# Patient Record
Sex: Female | Born: 1935 | Race: White | Hispanic: No | Marital: Married | Smoking: Never smoker
Health system: Southern US, Community
[De-identification: ages and names within clinical notes are randomized; demographics above are authoritative.]

## PROBLEM LIST (undated history)

## (undated) DIAGNOSIS — K743 Primary biliary cirrhosis: Secondary | ICD-10-CM

## (undated) DIAGNOSIS — M358 Other specified systemic involvement of connective tissue: Secondary | ICD-10-CM

## (undated) DIAGNOSIS — I2510 Atherosclerotic heart disease of native coronary artery without angina pectoris: Secondary | ICD-10-CM

## (undated) DIAGNOSIS — I10 Essential (primary) hypertension: Secondary | ICD-10-CM

## (undated) HISTORY — PX: ABDOMINAL HYSTERECTOMY: SHX81

---

## 2004-07-05 ENCOUNTER — Inpatient Hospital Stay (HOSPITAL_COMMUNITY): Admission: RE | Admit: 2004-07-05 | Discharge: 2004-07-08 | Payer: Self-pay | Admitting: Gynecology

## 2004-07-30 ENCOUNTER — Ambulatory Visit: Payer: Self-pay | Admitting: Gynecology

## 2005-09-11 ENCOUNTER — Ambulatory Visit: Payer: Self-pay | Admitting: Internal Medicine

## 2006-09-15 ENCOUNTER — Ambulatory Visit: Payer: Self-pay | Admitting: Unknown Physician Specialty

## 2007-01-19 ENCOUNTER — Ambulatory Visit: Payer: Self-pay | Admitting: Unknown Physician Specialty

## 2007-07-16 ENCOUNTER — Ambulatory Visit: Payer: Self-pay

## 2007-11-09 ENCOUNTER — Ambulatory Visit: Payer: Self-pay | Admitting: Unknown Physician Specialty

## 2007-11-10 ENCOUNTER — Ambulatory Visit: Payer: Self-pay | Admitting: Unknown Physician Specialty

## 2008-05-15 ENCOUNTER — Ambulatory Visit: Payer: Self-pay | Admitting: Unknown Physician Specialty

## 2008-06-23 ENCOUNTER — Inpatient Hospital Stay: Payer: Self-pay | Admitting: Internal Medicine

## 2008-07-08 ENCOUNTER — Inpatient Hospital Stay: Payer: Self-pay | Admitting: Internal Medicine

## 2009-05-12 ENCOUNTER — Emergency Department: Payer: Self-pay | Admitting: Internal Medicine

## 2009-06-19 ENCOUNTER — Inpatient Hospital Stay: Payer: Self-pay | Admitting: Internal Medicine

## 2009-09-20 ENCOUNTER — Inpatient Hospital Stay: Payer: Self-pay | Admitting: *Deleted

## 2009-11-01 ENCOUNTER — Ambulatory Visit: Payer: Self-pay | Admitting: Internal Medicine

## 2009-11-07 ENCOUNTER — Ambulatory Visit: Payer: Self-pay | Admitting: Internal Medicine

## 2010-02-25 ENCOUNTER — Emergency Department: Payer: Self-pay | Admitting: Internal Medicine

## 2010-03-06 ENCOUNTER — Observation Stay: Payer: Self-pay | Admitting: Internal Medicine

## 2010-03-11 ENCOUNTER — Emergency Department: Payer: Self-pay | Admitting: Emergency Medicine

## 2010-04-09 ENCOUNTER — Ambulatory Visit: Payer: Self-pay | Admitting: Sports Medicine

## 2010-04-29 ENCOUNTER — Ambulatory Visit: Payer: Self-pay | Admitting: Internal Medicine

## 2010-10-14 ENCOUNTER — Ambulatory Visit: Payer: Self-pay | Admitting: Gastroenterology

## 2011-06-23 DIAGNOSIS — I059 Rheumatic mitral valve disease, unspecified: Secondary | ICD-10-CM | POA: Diagnosis not present

## 2011-06-23 DIAGNOSIS — I119 Hypertensive heart disease without heart failure: Secondary | ICD-10-CM | POA: Diagnosis not present

## 2011-06-23 DIAGNOSIS — E782 Mixed hyperlipidemia: Secondary | ICD-10-CM | POA: Diagnosis not present

## 2011-06-23 DIAGNOSIS — I4891 Unspecified atrial fibrillation: Secondary | ICD-10-CM | POA: Diagnosis not present

## 2011-09-12 DIAGNOSIS — E785 Hyperlipidemia, unspecified: Secondary | ICD-10-CM | POA: Diagnosis not present

## 2011-09-12 DIAGNOSIS — I10 Essential (primary) hypertension: Secondary | ICD-10-CM | POA: Diagnosis not present

## 2011-10-16 DIAGNOSIS — E039 Hypothyroidism, unspecified: Secondary | ICD-10-CM | POA: Diagnosis not present

## 2011-10-20 DIAGNOSIS — E039 Hypothyroidism, unspecified: Secondary | ICD-10-CM | POA: Diagnosis not present

## 2011-10-22 ENCOUNTER — Ambulatory Visit: Payer: Self-pay | Admitting: Internal Medicine

## 2011-10-22 DIAGNOSIS — M25569 Pain in unspecified knee: Secondary | ICD-10-CM | POA: Diagnosis not present

## 2011-10-22 DIAGNOSIS — M898X9 Other specified disorders of bone, unspecified site: Secondary | ICD-10-CM | POA: Diagnosis not present

## 2011-12-10 DIAGNOSIS — H40019 Open angle with borderline findings, low risk, unspecified eye: Secondary | ICD-10-CM | POA: Diagnosis not present

## 2012-01-06 DIAGNOSIS — I059 Rheumatic mitral valve disease, unspecified: Secondary | ICD-10-CM | POA: Diagnosis not present

## 2012-01-06 DIAGNOSIS — I4891 Unspecified atrial fibrillation: Secondary | ICD-10-CM | POA: Diagnosis not present

## 2012-01-06 DIAGNOSIS — R0602 Shortness of breath: Secondary | ICD-10-CM | POA: Diagnosis not present

## 2012-01-06 DIAGNOSIS — I2510 Atherosclerotic heart disease of native coronary artery without angina pectoris: Secondary | ICD-10-CM | POA: Diagnosis not present

## 2012-01-16 DIAGNOSIS — M549 Dorsalgia, unspecified: Secondary | ICD-10-CM | POA: Diagnosis not present

## 2012-01-16 DIAGNOSIS — R350 Frequency of micturition: Secondary | ICD-10-CM | POA: Diagnosis not present

## 2012-01-21 DIAGNOSIS — I10 Essential (primary) hypertension: Secondary | ICD-10-CM | POA: Diagnosis not present

## 2012-01-21 DIAGNOSIS — E785 Hyperlipidemia, unspecified: Secondary | ICD-10-CM | POA: Diagnosis not present

## 2012-01-29 DIAGNOSIS — E785 Hyperlipidemia, unspecified: Secondary | ICD-10-CM | POA: Diagnosis not present

## 2012-01-29 DIAGNOSIS — I10 Essential (primary) hypertension: Secondary | ICD-10-CM | POA: Diagnosis not present

## 2012-01-29 DIAGNOSIS — M1990 Unspecified osteoarthritis, unspecified site: Secondary | ICD-10-CM | POA: Diagnosis not present

## 2012-01-29 DIAGNOSIS — E039 Hypothyroidism, unspecified: Secondary | ICD-10-CM | POA: Diagnosis not present

## 2012-02-25 DIAGNOSIS — I059 Rheumatic mitral valve disease, unspecified: Secondary | ICD-10-CM | POA: Diagnosis not present

## 2012-03-02 DIAGNOSIS — Z23 Encounter for immunization: Secondary | ICD-10-CM | POA: Diagnosis not present

## 2012-03-26 DIAGNOSIS — R350 Frequency of micturition: Secondary | ICD-10-CM | POA: Diagnosis not present

## 2012-03-26 DIAGNOSIS — N390 Urinary tract infection, site not specified: Secondary | ICD-10-CM | POA: Diagnosis not present

## 2012-04-01 DIAGNOSIS — H6520 Chronic serous otitis media, unspecified ear: Secondary | ICD-10-CM | POA: Diagnosis not present

## 2012-04-01 DIAGNOSIS — I10 Essential (primary) hypertension: Secondary | ICD-10-CM | POA: Diagnosis not present

## 2012-04-05 DIAGNOSIS — H6120 Impacted cerumen, unspecified ear: Secondary | ICD-10-CM | POA: Diagnosis not present

## 2012-04-05 DIAGNOSIS — H903 Sensorineural hearing loss, bilateral: Secondary | ICD-10-CM | POA: Diagnosis not present

## 2012-04-12 DIAGNOSIS — E039 Hypothyroidism, unspecified: Secondary | ICD-10-CM | POA: Diagnosis not present

## 2012-04-19 DIAGNOSIS — E039 Hypothyroidism, unspecified: Secondary | ICD-10-CM | POA: Diagnosis not present

## 2012-05-05 DIAGNOSIS — E039 Hypothyroidism, unspecified: Secondary | ICD-10-CM | POA: Diagnosis not present

## 2012-05-05 DIAGNOSIS — R5381 Other malaise: Secondary | ICD-10-CM | POA: Diagnosis not present

## 2012-05-05 DIAGNOSIS — R5383 Other fatigue: Secondary | ICD-10-CM | POA: Diagnosis not present

## 2012-05-05 DIAGNOSIS — E7800 Pure hypercholesterolemia, unspecified: Secondary | ICD-10-CM | POA: Diagnosis not present

## 2012-06-18 DIAGNOSIS — I2510 Atherosclerotic heart disease of native coronary artery without angina pectoris: Secondary | ICD-10-CM | POA: Diagnosis not present

## 2012-06-18 DIAGNOSIS — E785 Hyperlipidemia, unspecified: Secondary | ICD-10-CM | POA: Diagnosis not present

## 2012-06-18 DIAGNOSIS — I4891 Unspecified atrial fibrillation: Secondary | ICD-10-CM | POA: Diagnosis not present

## 2012-06-18 DIAGNOSIS — I10 Essential (primary) hypertension: Secondary | ICD-10-CM | POA: Diagnosis not present

## 2012-07-03 ENCOUNTER — Emergency Department: Payer: Self-pay | Admitting: Emergency Medicine

## 2012-07-03 DIAGNOSIS — S1093XA Contusion of unspecified part of neck, initial encounter: Secondary | ICD-10-CM | POA: Diagnosis not present

## 2012-07-03 DIAGNOSIS — Z79899 Other long term (current) drug therapy: Secondary | ICD-10-CM | POA: Diagnosis not present

## 2012-07-03 DIAGNOSIS — IMO0002 Reserved for concepts with insufficient information to code with codable children: Secondary | ICD-10-CM | POA: Diagnosis not present

## 2012-07-03 DIAGNOSIS — S0990XA Unspecified injury of head, initial encounter: Secondary | ICD-10-CM | POA: Diagnosis not present

## 2012-07-03 DIAGNOSIS — S0003XA Contusion of scalp, initial encounter: Secondary | ICD-10-CM | POA: Diagnosis not present

## 2012-07-03 DIAGNOSIS — I10 Essential (primary) hypertension: Secondary | ICD-10-CM | POA: Diagnosis not present

## 2012-07-03 LAB — URINALYSIS, COMPLETE
Glucose,UR: NEGATIVE mg/dL (ref 0–75)
Ketone: NEGATIVE
Nitrite: NEGATIVE
Ph: 7 (ref 4.5–8.0)
Protein: NEGATIVE
RBC,UR: 4 /HPF (ref 0–5)
WBC UR: NONE SEEN /HPF (ref 0–5)

## 2012-08-02 DIAGNOSIS — Z0000 Encounter for general adult medical examination without abnormal findings: Secondary | ICD-10-CM | POA: Diagnosis not present

## 2012-08-02 DIAGNOSIS — K219 Gastro-esophageal reflux disease without esophagitis: Secondary | ICD-10-CM | POA: Diagnosis not present

## 2012-08-03 DIAGNOSIS — N390 Urinary tract infection, site not specified: Secondary | ICD-10-CM | POA: Diagnosis not present

## 2012-10-07 DIAGNOSIS — M5412 Radiculopathy, cervical region: Secondary | ICD-10-CM | POA: Diagnosis not present

## 2012-10-07 DIAGNOSIS — M25519 Pain in unspecified shoulder: Secondary | ICD-10-CM | POA: Diagnosis not present

## 2012-10-07 DIAGNOSIS — M5030 Other cervical disc degeneration, unspecified cervical region: Secondary | ICD-10-CM | POA: Diagnosis not present

## 2012-10-07 DIAGNOSIS — M47817 Spondylosis without myelopathy or radiculopathy, lumbosacral region: Secondary | ICD-10-CM | POA: Diagnosis not present

## 2012-10-07 DIAGNOSIS — M4802 Spinal stenosis, cervical region: Secondary | ICD-10-CM | POA: Diagnosis not present

## 2012-10-21 DIAGNOSIS — J209 Acute bronchitis, unspecified: Secondary | ICD-10-CM | POA: Diagnosis not present

## 2012-10-21 DIAGNOSIS — R05 Cough: Secondary | ICD-10-CM | POA: Diagnosis not present

## 2012-10-26 DIAGNOSIS — R350 Frequency of micturition: Secondary | ICD-10-CM | POA: Diagnosis not present

## 2012-10-26 DIAGNOSIS — N390 Urinary tract infection, site not specified: Secondary | ICD-10-CM | POA: Diagnosis not present

## 2012-10-26 DIAGNOSIS — R319 Hematuria, unspecified: Secondary | ICD-10-CM | POA: Diagnosis not present

## 2012-10-26 DIAGNOSIS — N23 Unspecified renal colic: Secondary | ICD-10-CM | POA: Diagnosis not present

## 2012-11-04 DIAGNOSIS — E039 Hypothyroidism, unspecified: Secondary | ICD-10-CM | POA: Diagnosis not present

## 2012-11-05 ENCOUNTER — Ambulatory Visit: Payer: Self-pay | Admitting: Physical Medicine and Rehabilitation

## 2012-11-05 DIAGNOSIS — M47817 Spondylosis without myelopathy or radiculopathy, lumbosacral region: Secondary | ICD-10-CM | POA: Diagnosis not present

## 2012-11-05 DIAGNOSIS — M5030 Other cervical disc degeneration, unspecified cervical region: Secondary | ICD-10-CM | POA: Diagnosis not present

## 2012-11-05 DIAGNOSIS — M542 Cervicalgia: Secondary | ICD-10-CM | POA: Diagnosis not present

## 2012-11-08 DIAGNOSIS — M5412 Radiculopathy, cervical region: Secondary | ICD-10-CM | POA: Diagnosis not present

## 2012-11-08 DIAGNOSIS — M47817 Spondylosis without myelopathy or radiculopathy, lumbosacral region: Secondary | ICD-10-CM | POA: Diagnosis not present

## 2012-11-08 DIAGNOSIS — M4802 Spinal stenosis, cervical region: Secondary | ICD-10-CM | POA: Diagnosis not present

## 2012-11-08 DIAGNOSIS — M47812 Spondylosis without myelopathy or radiculopathy, cervical region: Secondary | ICD-10-CM | POA: Diagnosis not present

## 2012-11-18 DIAGNOSIS — J209 Acute bronchitis, unspecified: Secondary | ICD-10-CM | POA: Diagnosis not present

## 2012-12-01 DIAGNOSIS — IMO0002 Reserved for concepts with insufficient information to code with codable children: Secondary | ICD-10-CM | POA: Diagnosis not present

## 2012-12-01 DIAGNOSIS — M48062 Spinal stenosis, lumbar region with neurogenic claudication: Secondary | ICD-10-CM | POA: Diagnosis not present

## 2012-12-01 DIAGNOSIS — M5137 Other intervertebral disc degeneration, lumbosacral region: Secondary | ICD-10-CM | POA: Diagnosis not present

## 2012-12-20 DIAGNOSIS — I2510 Atherosclerotic heart disease of native coronary artery without angina pectoris: Secondary | ICD-10-CM | POA: Diagnosis not present

## 2012-12-20 DIAGNOSIS — I119 Hypertensive heart disease without heart failure: Secondary | ICD-10-CM | POA: Diagnosis not present

## 2012-12-20 DIAGNOSIS — I059 Rheumatic mitral valve disease, unspecified: Secondary | ICD-10-CM | POA: Diagnosis not present

## 2012-12-20 DIAGNOSIS — I209 Angina pectoris, unspecified: Secondary | ICD-10-CM | POA: Diagnosis not present

## 2012-12-31 DIAGNOSIS — H40009 Preglaucoma, unspecified, unspecified eye: Secondary | ICD-10-CM | POA: Diagnosis not present

## 2012-12-31 DIAGNOSIS — H40019 Open angle with borderline findings, low risk, unspecified eye: Secondary | ICD-10-CM | POA: Diagnosis not present

## 2013-01-17 DIAGNOSIS — E782 Mixed hyperlipidemia: Secondary | ICD-10-CM | POA: Diagnosis not present

## 2013-01-17 DIAGNOSIS — I495 Sick sinus syndrome: Secondary | ICD-10-CM | POA: Diagnosis not present

## 2013-01-17 DIAGNOSIS — I4891 Unspecified atrial fibrillation: Secondary | ICD-10-CM | POA: Diagnosis not present

## 2013-01-17 DIAGNOSIS — I2510 Atherosclerotic heart disease of native coronary artery without angina pectoris: Secondary | ICD-10-CM | POA: Diagnosis not present

## 2013-02-02 DIAGNOSIS — I10 Essential (primary) hypertension: Secondary | ICD-10-CM | POA: Diagnosis not present

## 2013-02-02 DIAGNOSIS — E785 Hyperlipidemia, unspecified: Secondary | ICD-10-CM | POA: Diagnosis not present

## 2013-02-22 DIAGNOSIS — M25519 Pain in unspecified shoulder: Secondary | ICD-10-CM | POA: Diagnosis not present

## 2013-02-22 DIAGNOSIS — M67919 Unspecified disorder of synovium and tendon, unspecified shoulder: Secondary | ICD-10-CM | POA: Diagnosis not present

## 2013-02-22 DIAGNOSIS — M47812 Spondylosis without myelopathy or radiculopathy, cervical region: Secondary | ICD-10-CM | POA: Diagnosis not present

## 2013-02-22 DIAGNOSIS — M5412 Radiculopathy, cervical region: Secondary | ICD-10-CM | POA: Diagnosis not present

## 2013-04-19 ENCOUNTER — Emergency Department: Payer: Self-pay | Admitting: Emergency Medicine

## 2013-04-19 DIAGNOSIS — Z95818 Presence of other cardiac implants and grafts: Secondary | ICD-10-CM | POA: Diagnosis not present

## 2013-04-19 DIAGNOSIS — I10 Essential (primary) hypertension: Secondary | ICD-10-CM | POA: Diagnosis not present

## 2013-04-19 DIAGNOSIS — Z7902 Long term (current) use of antithrombotics/antiplatelets: Secondary | ICD-10-CM | POA: Diagnosis not present

## 2013-04-19 DIAGNOSIS — R079 Chest pain, unspecified: Secondary | ICD-10-CM | POA: Diagnosis not present

## 2013-04-19 DIAGNOSIS — J069 Acute upper respiratory infection, unspecified: Secondary | ICD-10-CM | POA: Diagnosis not present

## 2013-04-19 DIAGNOSIS — Z79899 Other long term (current) drug therapy: Secondary | ICD-10-CM | POA: Diagnosis not present

## 2013-04-19 DIAGNOSIS — I498 Other specified cardiac arrhythmias: Secondary | ICD-10-CM | POA: Diagnosis not present

## 2013-04-19 DIAGNOSIS — R0602 Shortness of breath: Secondary | ICD-10-CM | POA: Diagnosis not present

## 2013-04-19 LAB — COMPREHENSIVE METABOLIC PANEL
Anion Gap: 3 — ABNORMAL LOW (ref 7–16)
Bilirubin,Total: 0.5 mg/dL (ref 0.2–1.0)
Calcium, Total: 9 mg/dL (ref 8.5–10.1)
Chloride: 104 mmol/L (ref 98–107)
Creatinine: 0.89 mg/dL (ref 0.60–1.30)
EGFR (African American): >60
EGFR (Non-African Amer.): >60
Osmolality: 276 (ref 275–301)
SGOT(AST): 24 U/L (ref 15–37)
SGPT (ALT): 27 U/L (ref 12–78)
Total Protein: 6.9 g/dL (ref 6.4–8.2)

## 2013-04-19 LAB — CBC
HCT: 43.7 % (ref 35.0–47.0)
MCHC: 34.4 g/dL (ref 32.0–36.0)
Platelet: 166 10*3/uL (ref 150–440)
RBC: 4.41 10*6/uL (ref 3.80–5.20)

## 2013-04-19 LAB — CK TOTAL AND CKMB (NOT AT ARMC): CK-MB: 1.2 ng/mL (ref 0.5–3.6)

## 2013-04-22 DIAGNOSIS — J209 Acute bronchitis, unspecified: Secondary | ICD-10-CM | POA: Diagnosis not present

## 2013-04-22 DIAGNOSIS — R5383 Other fatigue: Secondary | ICD-10-CM | POA: Diagnosis not present

## 2013-04-22 DIAGNOSIS — E039 Hypothyroidism, unspecified: Secondary | ICD-10-CM | POA: Diagnosis not present

## 2013-04-22 DIAGNOSIS — E559 Vitamin D deficiency, unspecified: Secondary | ICD-10-CM | POA: Diagnosis not present

## 2013-04-22 DIAGNOSIS — R269 Unspecified abnormalities of gait and mobility: Secondary | ICD-10-CM | POA: Diagnosis not present

## 2013-04-22 DIAGNOSIS — R5381 Other malaise: Secondary | ICD-10-CM | POA: Diagnosis not present

## 2013-07-01 DIAGNOSIS — E039 Hypothyroidism, unspecified: Secondary | ICD-10-CM | POA: Diagnosis not present

## 2013-07-01 DIAGNOSIS — I10 Essential (primary) hypertension: Secondary | ICD-10-CM | POA: Diagnosis not present

## 2013-07-26 DIAGNOSIS — R51 Headache: Secondary | ICD-10-CM | POA: Diagnosis not present

## 2013-07-27 DIAGNOSIS — R51 Headache: Secondary | ICD-10-CM | POA: Diagnosis not present

## 2013-07-27 DIAGNOSIS — R5383 Other fatigue: Secondary | ICD-10-CM | POA: Diagnosis not present

## 2013-07-27 DIAGNOSIS — R413 Other amnesia: Secondary | ICD-10-CM | POA: Diagnosis not present

## 2013-07-27 DIAGNOSIS — R5381 Other malaise: Secondary | ICD-10-CM | POA: Diagnosis not present

## 2013-08-01 ENCOUNTER — Ambulatory Visit: Payer: Self-pay | Admitting: Neurology

## 2013-08-01 DIAGNOSIS — S0990XA Unspecified injury of head, initial encounter: Secondary | ICD-10-CM | POA: Diagnosis not present

## 2013-08-01 DIAGNOSIS — R51 Headache: Secondary | ICD-10-CM | POA: Diagnosis not present

## 2013-08-01 DIAGNOSIS — R413 Other amnesia: Secondary | ICD-10-CM | POA: Diagnosis not present

## 2013-08-10 DIAGNOSIS — G9332 Myalgic encephalomyelitis/chronic fatigue syndrome: Secondary | ICD-10-CM | POA: Diagnosis not present

## 2013-08-10 DIAGNOSIS — R5382 Chronic fatigue, unspecified: Secondary | ICD-10-CM | POA: Diagnosis not present

## 2013-08-10 DIAGNOSIS — R51 Headache: Secondary | ICD-10-CM | POA: Diagnosis not present

## 2013-08-10 DIAGNOSIS — R413 Other amnesia: Secondary | ICD-10-CM | POA: Diagnosis not present

## 2013-08-18 DIAGNOSIS — E039 Hypothyroidism, unspecified: Secondary | ICD-10-CM | POA: Diagnosis not present

## 2013-08-18 DIAGNOSIS — Z0000 Encounter for general adult medical examination without abnormal findings: Secondary | ICD-10-CM | POA: Diagnosis not present

## 2013-08-18 DIAGNOSIS — R7989 Other specified abnormal findings of blood chemistry: Secondary | ICD-10-CM | POA: Diagnosis not present

## 2013-08-18 DIAGNOSIS — N3000 Acute cystitis without hematuria: Secondary | ICD-10-CM | POA: Diagnosis not present

## 2013-08-18 DIAGNOSIS — E785 Hyperlipidemia, unspecified: Secondary | ICD-10-CM | POA: Diagnosis not present

## 2013-08-26 DIAGNOSIS — E039 Hypothyroidism, unspecified: Secondary | ICD-10-CM | POA: Diagnosis not present

## 2013-08-26 DIAGNOSIS — R300 Dysuria: Secondary | ICD-10-CM | POA: Diagnosis not present

## 2013-08-31 DIAGNOSIS — I495 Sick sinus syndrome: Secondary | ICD-10-CM | POA: Diagnosis not present

## 2013-08-31 DIAGNOSIS — R42 Dizziness and giddiness: Secondary | ICD-10-CM | POA: Diagnosis not present

## 2013-08-31 DIAGNOSIS — Z7901 Long term (current) use of anticoagulants: Secondary | ICD-10-CM | POA: Diagnosis not present

## 2013-08-31 DIAGNOSIS — I4891 Unspecified atrial fibrillation: Secondary | ICD-10-CM | POA: Diagnosis not present

## 2013-09-05 DIAGNOSIS — I4891 Unspecified atrial fibrillation: Secondary | ICD-10-CM | POA: Diagnosis not present

## 2013-09-14 DIAGNOSIS — I119 Hypertensive heart disease without heart failure: Secondary | ICD-10-CM | POA: Diagnosis not present

## 2013-09-14 DIAGNOSIS — I059 Rheumatic mitral valve disease, unspecified: Secondary | ICD-10-CM | POA: Diagnosis not present

## 2013-09-14 DIAGNOSIS — I4891 Unspecified atrial fibrillation: Secondary | ICD-10-CM | POA: Diagnosis not present

## 2013-09-14 DIAGNOSIS — I495 Sick sinus syndrome: Secondary | ICD-10-CM | POA: Diagnosis not present

## 2013-09-15 DIAGNOSIS — R7989 Other specified abnormal findings of blood chemistry: Secondary | ICD-10-CM | POA: Diagnosis not present

## 2013-09-15 DIAGNOSIS — F339 Major depressive disorder, recurrent, unspecified: Secondary | ICD-10-CM | POA: Diagnosis not present

## 2013-09-15 DIAGNOSIS — R319 Hematuria, unspecified: Secondary | ICD-10-CM | POA: Diagnosis not present

## 2013-09-15 DIAGNOSIS — F0390 Unspecified dementia without behavioral disturbance: Secondary | ICD-10-CM | POA: Diagnosis not present

## 2013-09-15 DIAGNOSIS — R3129 Other microscopic hematuria: Secondary | ICD-10-CM | POA: Diagnosis not present

## 2013-11-02 DIAGNOSIS — N183 Chronic kidney disease, stage 3 unspecified: Secondary | ICD-10-CM | POA: Diagnosis not present

## 2013-11-02 DIAGNOSIS — F0390 Unspecified dementia without behavioral disturbance: Secondary | ICD-10-CM | POA: Diagnosis not present

## 2013-11-02 DIAGNOSIS — R3129 Other microscopic hematuria: Secondary | ICD-10-CM | POA: Diagnosis not present

## 2013-11-02 DIAGNOSIS — E538 Deficiency of other specified B group vitamins: Secondary | ICD-10-CM | POA: Diagnosis not present

## 2013-11-02 DIAGNOSIS — E559 Vitamin D deficiency, unspecified: Secondary | ICD-10-CM | POA: Diagnosis not present

## 2013-11-09 DIAGNOSIS — N952 Postmenopausal atrophic vaginitis: Secondary | ICD-10-CM | POA: Diagnosis not present

## 2013-11-09 DIAGNOSIS — R319 Hematuria, unspecified: Secondary | ICD-10-CM | POA: Diagnosis not present

## 2013-11-14 ENCOUNTER — Ambulatory Visit: Payer: Self-pay | Admitting: Obstetrics and Gynecology

## 2013-11-14 DIAGNOSIS — R319 Hematuria, unspecified: Secondary | ICD-10-CM | POA: Diagnosis not present

## 2013-11-14 DIAGNOSIS — K7689 Other specified diseases of liver: Secondary | ICD-10-CM | POA: Diagnosis not present

## 2013-11-24 DIAGNOSIS — R319 Hematuria, unspecified: Secondary | ICD-10-CM | POA: Diagnosis not present

## 2013-11-24 DIAGNOSIS — N952 Postmenopausal atrophic vaginitis: Secondary | ICD-10-CM | POA: Diagnosis not present

## 2013-12-16 DIAGNOSIS — S50909A Unspecified superficial injury of unspecified elbow, initial encounter: Secondary | ICD-10-CM | POA: Diagnosis not present

## 2013-12-23 DIAGNOSIS — F0390 Unspecified dementia without behavioral disturbance: Secondary | ICD-10-CM | POA: Diagnosis not present

## 2013-12-23 DIAGNOSIS — Z23 Encounter for immunization: Secondary | ICD-10-CM | POA: Diagnosis not present

## 2013-12-23 DIAGNOSIS — E538 Deficiency of other specified B group vitamins: Secondary | ICD-10-CM | POA: Diagnosis not present

## 2013-12-23 DIAGNOSIS — E559 Vitamin D deficiency, unspecified: Secondary | ICD-10-CM | POA: Diagnosis not present

## 2013-12-23 DIAGNOSIS — F339 Major depressive disorder, recurrent, unspecified: Secondary | ICD-10-CM | POA: Diagnosis not present

## 2013-12-23 DIAGNOSIS — N183 Chronic kidney disease, stage 3 unspecified: Secondary | ICD-10-CM | POA: Diagnosis not present

## 2013-12-23 DIAGNOSIS — K7689 Other specified diseases of liver: Secondary | ICD-10-CM | POA: Diagnosis not present

## 2013-12-23 DIAGNOSIS — R3129 Other microscopic hematuria: Secondary | ICD-10-CM | POA: Diagnosis not present

## 2013-12-23 DIAGNOSIS — R718 Other abnormality of red blood cells: Secondary | ICD-10-CM | POA: Diagnosis not present

## 2013-12-27 DIAGNOSIS — K7689 Other specified diseases of liver: Secondary | ICD-10-CM | POA: Diagnosis not present

## 2014-01-17 ENCOUNTER — Emergency Department: Payer: Self-pay | Admitting: Emergency Medicine

## 2014-01-17 DIAGNOSIS — I498 Other specified cardiac arrhythmias: Secondary | ICD-10-CM | POA: Diagnosis not present

## 2014-01-17 DIAGNOSIS — Z7902 Long term (current) use of antithrombotics/antiplatelets: Secondary | ICD-10-CM | POA: Diagnosis not present

## 2014-01-17 DIAGNOSIS — R0602 Shortness of breath: Secondary | ICD-10-CM | POA: Diagnosis not present

## 2014-01-17 DIAGNOSIS — Z79899 Other long term (current) drug therapy: Secondary | ICD-10-CM | POA: Diagnosis not present

## 2014-01-17 DIAGNOSIS — R059 Cough, unspecified: Secondary | ICD-10-CM | POA: Diagnosis not present

## 2014-01-17 DIAGNOSIS — R011 Cardiac murmur, unspecified: Secondary | ICD-10-CM | POA: Diagnosis not present

## 2014-01-17 DIAGNOSIS — F411 Generalized anxiety disorder: Secondary | ICD-10-CM | POA: Diagnosis not present

## 2014-01-17 DIAGNOSIS — R319 Hematuria, unspecified: Secondary | ICD-10-CM | POA: Diagnosis not present

## 2014-01-17 DIAGNOSIS — I10 Essential (primary) hypertension: Secondary | ICD-10-CM | POA: Diagnosis not present

## 2014-01-17 DIAGNOSIS — J449 Chronic obstructive pulmonary disease, unspecified: Secondary | ICD-10-CM | POA: Diagnosis not present

## 2014-01-17 DIAGNOSIS — R42 Dizziness and giddiness: Secondary | ICD-10-CM | POA: Diagnosis not present

## 2014-01-17 DIAGNOSIS — J9819 Other pulmonary collapse: Secondary | ICD-10-CM | POA: Diagnosis not present

## 2014-01-17 LAB — CBC
HCT: 45.9 % (ref 35.0–47.0)
HGB: 15 g/dL (ref 12.0–16.0)
MCH: 32.8 pg (ref 26.0–34.0)
MCHC: 32.6 g/dL (ref 32.0–36.0)
MCV: 101 fL — ABNORMAL HIGH (ref 80–100)
PLATELETS: 167 10*3/uL (ref 150–440)
RBC: 4.57 10*6/uL (ref 3.80–5.20)
RDW: 14 % (ref 11.5–14.5)
WBC: 7.5 10*3/uL (ref 3.6–11.0)

## 2014-01-17 LAB — PROTIME-INR
INR: 1
Prothrombin Time: 12.9 secs (ref 11.5–14.7)

## 2014-01-17 LAB — BASIC METABOLIC PANEL
Anion Gap: 9 (ref 7–16)
BUN: 21 mg/dL — AB (ref 7–18)
CREATININE: 1.15 mg/dL (ref 0.60–1.30)
Calcium, Total: 9.1 mg/dL (ref 8.5–10.1)
Chloride: 105 mmol/L (ref 98–107)
Co2: 29 mmol/L (ref 21–32)
EGFR (Non-African Amer.): 44 — ABNORMAL LOW
GFR CALC AF AMER: 51 — AB
Glucose: 86 mg/dL (ref 65–99)
Osmolality: 287 (ref 275–301)
Potassium: 4 mmol/L (ref 3.5–5.1)
SODIUM: 143 mmol/L (ref 136–145)

## 2014-01-17 LAB — PRO B NATRIURETIC PEPTIDE: B-TYPE NATIURETIC PEPTID: 217 pg/mL (ref 0–450)

## 2014-01-17 LAB — TROPONIN I
Troponin-I: <0.02 ng/mL
Troponin-I: <0.02 ng/mL

## 2014-01-19 ENCOUNTER — Emergency Department: Payer: Self-pay | Admitting: Emergency Medicine

## 2014-01-19 DIAGNOSIS — R0602 Shortness of breath: Secondary | ICD-10-CM | POA: Diagnosis not present

## 2014-01-19 DIAGNOSIS — J984 Other disorders of lung: Secondary | ICD-10-CM | POA: Diagnosis not present

## 2014-01-19 DIAGNOSIS — I10 Essential (primary) hypertension: Secondary | ICD-10-CM | POA: Diagnosis not present

## 2014-01-19 LAB — CBC WITH DIFFERENTIAL/PLATELET
Basophil #: 0.1 10*3/uL (ref 0.0–0.1)
Basophil %: 0.7 %
EOS ABS: 0.3 10*3/uL (ref 0.0–0.7)
Eosinophil %: 4.1 %
HCT: 41.4 % (ref 35.0–47.0)
HGB: 13.7 g/dL (ref 12.0–16.0)
Lymphocyte #: 1.7 10*3/uL (ref 1.0–3.6)
Lymphocyte %: 24.4 %
MCH: 33.1 pg (ref 26.0–34.0)
MCHC: 33.2 g/dL (ref 32.0–36.0)
MCV: 100 fL (ref 80–100)
MONO ABS: 0.8 x10 3/mm (ref 0.2–0.9)
Monocyte %: 11.1 %
Neutrophil #: 4.2 10*3/uL (ref 1.4–6.5)
Neutrophil %: 59.7 %
PLATELETS: 164 10*3/uL (ref 150–440)
RBC: 4.14 10*6/uL (ref 3.80–5.20)
RDW: 14.3 % (ref 11.5–14.5)
WBC: 7 10*3/uL (ref 3.6–11.0)

## 2014-01-19 LAB — URINALYSIS, COMPLETE
Bacteria: NONE SEEN
Bilirubin,UR: NEGATIVE
GLUCOSE, UR: NEGATIVE mg/dL (ref 0–75)
KETONE: NEGATIVE
Leukocyte Esterase: NEGATIVE
NITRITE: NEGATIVE
PH: 7 (ref 4.5–8.0)
PROTEIN: NEGATIVE
SPECIFIC GRAVITY: 1.023 (ref 1.003–1.030)
Squamous Epithelial: <1
WBC UR: <1 /HPF (ref 0–5)

## 2014-01-19 LAB — COMPREHENSIVE METABOLIC PANEL
ANION GAP: 5 — AB (ref 7–16)
AST: 31 U/L (ref 15–37)
Albumin: 3.4 g/dL (ref 3.4–5.0)
Alkaline Phosphatase: 60 U/L
BUN: 18 mg/dL (ref 7–18)
Bilirubin,Total: 0.4 mg/dL (ref 0.2–1.0)
CALCIUM: 8.7 mg/dL (ref 8.5–10.1)
CREATININE: 1 mg/dL (ref 0.60–1.30)
Chloride: 109 mmol/L — ABNORMAL HIGH (ref 98–107)
Co2: 26 mmol/L (ref 21–32)
EGFR (African American): >60
GFR CALC NON AF AMER: 52 — AB
Glucose: 93 mg/dL (ref 65–99)
Osmolality: 281 (ref 275–301)
POTASSIUM: 4 mmol/L (ref 3.5–5.1)
SGPT (ALT): 19 U/L
Sodium: 140 mmol/L (ref 136–145)
TOTAL PROTEIN: 7.4 g/dL (ref 6.4–8.2)

## 2014-01-19 LAB — CK TOTAL AND CKMB (NOT AT ARMC)
CK, Total: 94 U/L
CK-MB: 1.2 ng/mL (ref 0.5–3.6)

## 2014-01-19 LAB — PROTIME-INR
INR: 1
Prothrombin Time: 12.6 secs (ref 11.5–14.7)

## 2014-01-19 LAB — D-DIMER(ARMC): D-Dimer: 786 ng/ml

## 2014-01-19 LAB — TSH: THYROID STIMULATING HORM: 5.2 u[IU]/mL — AB

## 2014-01-19 LAB — TROPONIN I: Troponin-I: <0.02 ng/mL

## 2014-01-20 DIAGNOSIS — F411 Generalized anxiety disorder: Secondary | ICD-10-CM | POA: Diagnosis not present

## 2014-01-20 DIAGNOSIS — R0602 Shortness of breath: Secondary | ICD-10-CM | POA: Diagnosis not present

## 2014-01-25 DIAGNOSIS — R3129 Other microscopic hematuria: Secondary | ICD-10-CM | POA: Diagnosis not present

## 2014-02-09 DIAGNOSIS — R0602 Shortness of breath: Secondary | ICD-10-CM | POA: Diagnosis not present

## 2014-02-09 DIAGNOSIS — R000 Tachycardia, unspecified: Secondary | ICD-10-CM | POA: Diagnosis not present

## 2014-03-02 DIAGNOSIS — I2510 Atherosclerotic heart disease of native coronary artery without angina pectoris: Secondary | ICD-10-CM | POA: Diagnosis not present

## 2014-03-02 DIAGNOSIS — I480 Paroxysmal atrial fibrillation: Secondary | ICD-10-CM | POA: Insufficient documentation

## 2014-03-02 DIAGNOSIS — E785 Hyperlipidemia, unspecified: Secondary | ICD-10-CM | POA: Diagnosis not present

## 2014-03-02 DIAGNOSIS — I4891 Unspecified atrial fibrillation: Secondary | ICD-10-CM | POA: Diagnosis not present

## 2014-03-02 DIAGNOSIS — I10 Essential (primary) hypertension: Secondary | ICD-10-CM | POA: Diagnosis not present

## 2014-03-02 DIAGNOSIS — R000 Tachycardia, unspecified: Secondary | ICD-10-CM | POA: Diagnosis not present

## 2014-03-06 DIAGNOSIS — Q809 Congenital ichthyosis, unspecified: Secondary | ICD-10-CM | POA: Diagnosis not present

## 2014-03-06 DIAGNOSIS — C44621 Squamous cell carcinoma of skin of unspecified upper limb, including shoulder: Secondary | ICD-10-CM | POA: Diagnosis not present

## 2014-03-06 DIAGNOSIS — L821 Other seborrheic keratosis: Secondary | ICD-10-CM | POA: Diagnosis not present

## 2014-03-06 DIAGNOSIS — L578 Other skin changes due to chronic exposure to nonionizing radiation: Secondary | ICD-10-CM | POA: Diagnosis not present

## 2014-03-07 DIAGNOSIS — I4891 Unspecified atrial fibrillation: Secondary | ICD-10-CM | POA: Diagnosis not present

## 2014-03-17 DIAGNOSIS — N390 Urinary tract infection, site not specified: Secondary | ICD-10-CM | POA: Diagnosis not present

## 2014-03-20 DIAGNOSIS — E782 Mixed hyperlipidemia: Secondary | ICD-10-CM | POA: Diagnosis not present

## 2014-03-20 DIAGNOSIS — I480 Paroxysmal atrial fibrillation: Secondary | ICD-10-CM | POA: Diagnosis not present

## 2014-03-20 DIAGNOSIS — I10 Essential (primary) hypertension: Secondary | ICD-10-CM | POA: Diagnosis not present

## 2014-03-20 DIAGNOSIS — I2510 Atherosclerotic heart disease of native coronary artery without angina pectoris: Secondary | ICD-10-CM | POA: Diagnosis not present

## 2014-03-20 DIAGNOSIS — I4891 Unspecified atrial fibrillation: Secondary | ICD-10-CM | POA: Diagnosis not present

## 2014-03-22 DIAGNOSIS — I493 Ventricular premature depolarization: Secondary | ICD-10-CM | POA: Insufficient documentation

## 2014-03-31 DIAGNOSIS — Z23 Encounter for immunization: Secondary | ICD-10-CM | POA: Diagnosis not present

## 2014-07-19 ENCOUNTER — Ambulatory Visit: Payer: Self-pay | Admitting: Family Medicine

## 2014-07-19 DIAGNOSIS — M25561 Pain in right knee: Secondary | ICD-10-CM | POA: Diagnosis not present

## 2014-07-19 DIAGNOSIS — M7989 Other specified soft tissue disorders: Secondary | ICD-10-CM | POA: Diagnosis not present

## 2014-07-19 DIAGNOSIS — M25562 Pain in left knee: Secondary | ICD-10-CM | POA: Diagnosis not present

## 2014-07-19 DIAGNOSIS — M25569 Pain in unspecified knee: Secondary | ICD-10-CM | POA: Diagnosis not present

## 2014-07-19 DIAGNOSIS — M7522 Bicipital tendinitis, left shoulder: Secondary | ICD-10-CM | POA: Diagnosis not present

## 2014-07-27 DIAGNOSIS — M1711 Unilateral primary osteoarthritis, right knee: Secondary | ICD-10-CM | POA: Diagnosis not present

## 2014-08-22 DIAGNOSIS — H905 Unspecified sensorineural hearing loss: Secondary | ICD-10-CM | POA: Diagnosis not present

## 2014-08-22 DIAGNOSIS — H6123 Impacted cerumen, bilateral: Secondary | ICD-10-CM | POA: Diagnosis not present

## 2014-09-18 DIAGNOSIS — D7589 Other specified diseases of blood and blood-forming organs: Secondary | ICD-10-CM | POA: Diagnosis not present

## 2014-09-18 DIAGNOSIS — R300 Dysuria: Secondary | ICD-10-CM | POA: Diagnosis not present

## 2014-09-18 DIAGNOSIS — E039 Hypothyroidism, unspecified: Secondary | ICD-10-CM | POA: Diagnosis not present

## 2014-09-18 DIAGNOSIS — F0390 Unspecified dementia without behavioral disturbance: Secondary | ICD-10-CM | POA: Diagnosis not present

## 2014-09-18 DIAGNOSIS — E559 Vitamin D deficiency, unspecified: Secondary | ICD-10-CM | POA: Diagnosis not present

## 2014-09-18 DIAGNOSIS — E785 Hyperlipidemia, unspecified: Secondary | ICD-10-CM | POA: Diagnosis not present

## 2014-09-18 DIAGNOSIS — I10 Essential (primary) hypertension: Secondary | ICD-10-CM | POA: Diagnosis not present

## 2014-09-18 DIAGNOSIS — E538 Deficiency of other specified B group vitamins: Secondary | ICD-10-CM | POA: Diagnosis not present

## 2014-09-18 DIAGNOSIS — N183 Chronic kidney disease, stage 3 (moderate): Secondary | ICD-10-CM | POA: Diagnosis not present

## 2014-10-16 DIAGNOSIS — R319 Hematuria, unspecified: Secondary | ICD-10-CM | POA: Diagnosis not present

## 2014-10-16 DIAGNOSIS — I10 Essential (primary) hypertension: Secondary | ICD-10-CM | POA: Diagnosis not present

## 2014-10-16 DIAGNOSIS — R300 Dysuria: Secondary | ICD-10-CM | POA: Diagnosis not present

## 2014-10-16 DIAGNOSIS — F0390 Unspecified dementia without behavioral disturbance: Secondary | ICD-10-CM | POA: Diagnosis not present

## 2014-10-23 ENCOUNTER — Other Ambulatory Visit: Payer: Self-pay

## 2014-10-23 ENCOUNTER — Emergency Department
Admission: EM | Admit: 2014-10-23 | Discharge: 2014-10-23 | Disposition: A | Discharge status: Home / Self Care | Payer: Medicare Other | Attending: Emergency Medicine | Admitting: Emergency Medicine

## 2014-10-23 ENCOUNTER — Emergency Department: Payer: Medicare Other

## 2014-10-23 ENCOUNTER — Encounter: Payer: Self-pay | Admitting: Emergency Medicine

## 2014-10-23 DIAGNOSIS — I2510 Atherosclerotic heart disease of native coronary artery without angina pectoris: Secondary | ICD-10-CM | POA: Diagnosis not present

## 2014-10-23 DIAGNOSIS — R002 Palpitations: Secondary | ICD-10-CM | POA: Diagnosis not present

## 2014-10-23 DIAGNOSIS — Z9861 Coronary angioplasty status: Secondary | ICD-10-CM | POA: Insufficient documentation

## 2014-10-23 DIAGNOSIS — I10 Essential (primary) hypertension: Secondary | ICD-10-CM | POA: Diagnosis not present

## 2014-10-23 HISTORY — DX: Other specified systemic involvement of connective tissue: M35.8

## 2014-10-23 HISTORY — DX: Primary biliary cirrhosis: K74.3

## 2014-10-23 HISTORY — DX: Atherosclerotic heart disease of native coronary artery without angina pectoris: I25.10

## 2014-10-23 HISTORY — DX: Essential (primary) hypertension: I10

## 2014-10-23 LAB — BASIC METABOLIC PANEL
ANION GAP: 8 (ref 5–15)
BUN: 18 mg/dL (ref 6–20)
CALCIUM: 9.3 mg/dL (ref 8.9–10.3)
CHLORIDE: 104 mmol/L (ref 101–111)
CO2: 29 mmol/L (ref 22–32)
Creatinine, Ser: 0.97 mg/dL (ref 0.44–1.00)
GFR calc Af Amer: >60 mL/min (ref >60)
GFR, EST NON AFRICAN AMERICAN: 53 mL/min — AB (ref >60)
Glucose, Bld: 123 mg/dL — ABNORMAL HIGH (ref 65–99)
POTASSIUM: 3.8 mmol/L (ref 3.5–5.1)
Sodium: 141 mmol/L (ref 135–145)

## 2014-10-23 LAB — CBC
HEMATOCRIT: 44.4 % (ref 35.0–47.0)
Hemoglobin: 15 g/dL (ref 12.0–16.0)
MCH: 32.8 pg (ref 26.0–34.0)
MCHC: 33.7 g/dL (ref 32.0–36.0)
MCV: 97.4 fL (ref 80.0–100.0)
PLATELETS: 155 10*3/uL (ref 150–440)
RBC: 4.56 MIL/uL (ref 3.80–5.20)
RDW: 13.9 % (ref 11.5–14.5)
WBC: 5.8 10*3/uL (ref 3.6–11.0)

## 2014-10-23 LAB — TROPONIN I: Troponin I: <0.03 ng/mL (ref <0.031)

## 2014-10-23 NOTE — Discharge Instructions (Signed)
As we discussed, your workup today including your EKG, labs, and chest x-ray are normal and reassuring.  You may be suffering from intermittent episodes of a heart arrhythmia, but we cannot see at at this time because her heart is beating normally.  We recommend that he follow up with your cardiologist on Thursday as scheduled.  If he develop new or worsening symptoms that concern you, please return immediately to the emergency department.   Palpitations A palpitation is the feeling that your heartbeat is irregular. It may feel like your heart is fluttering or skipping a beat. It may also feel like your heart is beating faster than normal. This is usually not a serious problem. In some cases, you may need more medical tests. HOME CARE  Avoid:  Caffeine in coffee, tea, soft drinks, diet pills, and energy drinks.  Chocolate.  Alcohol.  Stop smoking if you smoke.  Reduce your stress and anxiety. Try:  A method that measures bodily functions so you can learn to control them (biofeedback).  Yoga.  Meditation.  Physical activity such as swimming, jogging, or walking.  Get plenty of rest and sleep. GET HELP IF:  Your fast or irregular heartbeat continues after 24 hours.  Your palpitations occur more often. GET HELP RIGHT AWAY IF:   You have chest pain.  You feel short of breath.  You have a very bad headache.  You feel dizzy or pass out (faint). MAKE SURE YOU:   Understand these instructions.  Will watch your condition.  Will get help right away if you are not doing well or get worse. Document Released: 03/04/2008 Document Revised: 10/10/2013 Document Reviewed: 07/25/2011 [facility] Patient Information 2015 [place], [place]. This information is not intended to replace advice given to you by your health care provider. Make sure you discuss any questions you have with your health care provider.

## 2014-10-23 NOTE — ED Provider Notes (Signed)
[facility] Emergency Department Provider Note  ____________________________________________  Time seen: Approximately 9:15 AM  I have reviewed the triage vital signs and the nursing notes.   HISTORY  Chief Complaint Tachycardia and palpitations    HPI Felicia Terry is a 81 y.o. female with a medical history of hypertension and coronary artery disease with one stent who presents after feeling palpitations last night and then again this morning.  She reports that the episode last night lasted about an hour and this morning it was "more than an hour".  She states that along with the feeling of palpitations she felt some heaviness in her chest but no shortness of breath.  She felt some dizziness during the palpitations.  Currently she is asymptomatic.   Past Medical History  Diagnosis Date  . Hypertension   . Reynolds syndrome   . Coronary artery disease     cardiac stent    There are no active problems to display for this patient.   Past Surgical History  Procedure Laterality Date  . Abdominal hysterectomy      No current outpatient prescriptions on file.  Allergies Review of patient's allergies indicates no known allergies.  History reviewed. No pertinent family history.  Social History History  Substance Use Topics  . Smoking status: Never Smoker   . Smokeless tobacco: Not on file  . Alcohol Use: No    Review of Systems Constitutional: No fever/chills Eyes: No visual changes. ENT: No sore throat. Cardiovascular: pressure last night, now asymptomatic  Respiratory: Denies shortness of breath. Gastrointestinal: No abdominal pain.  No nausea, no vomiting.  No diarrhea.  No constipation. Genitourinary: Negative for dysuria. Musculoskeletal: Negative for back pain. Skin: Negative for rash. Neurological: Negative for headaches, focal weakness or numbness.  10-point ROS otherwise  negative.  ____________________________________________   PHYSICAL EXAM:  VITAL SIGNS: ED Triage Vitals  Enc Vitals Group     BP 10/23/14 0907 193/64 mmHg     Pulse Rate 10/23/14 0907 61     Resp 10/23/14 0907 18     Temp 10/23/14 0907 97.7 F (36.5 C)     Temp Source 10/23/14 0907 Oral     SpO2 10/23/14 0907 97 %     Weight 10/23/14 0907 130 lb (58.968 kg)     Height 10/23/14 0907 5\' 1"  (1.549 m)     Head Cir --      Peak Flow --      Pain Score 10/23/14 0908 6     Pain Loc --      Pain Edu? --      Excl. in [place]? --     Constitutional: Alert and oriented. Well appearing and in no acute distress.  Anxious but appropriate about her recent symptoms. Eyes: Conjunctivae are normal. PERRL. EOMI. Head: Atraumatic. Nose: No congestion/rhinnorhea. Mouth/Throat: Mucous membranes are moist.  Oropharynx non-erythematous. Neck: No stridor.   Cardiovascular: Normal rate, regular rhythm. Grossly normal heart sounds.  Good peripheral circulation. Respiratory: Normal respiratory effort.  No retractions. Lungs CTAB. Gastrointestinal: Soft and nontender. No distention. No abdominal bruits. No CVA tenderness. Musculoskeletal: No lower extremity tenderness nor edema.  No joint effusions. Neurologic:  Normal speech and language. No gross focal neurologic deficits are appreciated. Speech is normal. No gait instability. Skin:  Skin is warm, dry and intact. No rash noted. Psychiatric: Mood and affect are normal. Speech and behavior are normal.  ____________________________________________   LABS (all labs ordered are listed, but only abnormal results are  displayed)  Labs Reviewed  BASIC METABOLIC PANEL - Abnormal; Notable for the following:    Glucose, Bld 123 (*)    GFR calc non Af Amer 53 (*)    All other components within normal limits  CBC  TROPONIN I   ____________________________________________  EKG  ED ECG REPORT   Date: 10/23/2014  EKG Time: 09:07  Rate: 62  Rhythm:  normal sinus rhythm  Axis: Normal  Intervals:none  ST&T Change: Nonspecific ST changes  ____________________________________________  RADIOLOGY  Dg Chest 2 View  10/23/2014   CLINICAL DATA:  Cardiac palpitations  EXAM: CHEST  2 VIEW  COMPARISON:  01/17/2014  FINDINGS: Cardiac shadow is stable. The lungs are mildly hyperinflated. No focal infiltrate or sizable effusion is seen. Some scarring is again noted in the left lung base. No bony abnormality is noted.  IMPRESSION: No acute abnormality noted.   Electronically Signed   By: Inez Catalina M.D.   On: 10/23/2014 10:07    ____________________________________________   PROCEDURES  Procedure(s) performed: None  Critical Care performed: No  ____________________________________________   INITIAL IMPRESSION / ASSESSMENT AND PLAN / ED COURSE  Pertinent labs & imaging results that were available during my care of the patient were reviewed by me and considered in my medical decision making (see chart for details).  Most likely SVT, less likely other less benign arrhythmias (a-fib, etc).  Well-appearing, normal EKG, normal labs.  Patient wants very strongly to go home.  Suggested possibly staying for a second troponin, but she insists she wants to go and she has cardiology follow up in three days; given that her "chest pressure" was last night and she is asymptomatic now with a negative troponin and normal EKG, I believe discharge is appropriate.  I encouraged her to talk to her cardiologist and/or PCP about possibly getting a Holter monitor.  She left comfortable in no acute distress. ____________________________________________   FINAL CLINICAL IMPRESSION(S) / ED DIAGNOSES  Final diagnoses:  Palpitations     Hinda Kehr, MD 10/23/14 2054

## 2014-10-23 NOTE — ED Notes (Signed)
Pt to ed with c/o feeling like her heart was racing this am.  Pt states it started when she awoke this am and yesterday but reports as the day goes on she feels better.  Pt reports some "heaviness in chest" this am as well.  Pt denies sob, does report she felt dizzy with the rapid heart beat this am.

## 2014-10-23 NOTE — ED Notes (Signed)
Pt informed to return if any life threatening symptoms occur.  

## 2014-10-23 NOTE — ED Notes (Signed)
Nurse enters room to give pt a verbal update about plan of care. Pt sitting in chair with her clothes on stating, "i want to go". Pt. Starts crying stating "this place is making my sick".  Nurse set at bedside beside pt in order to help understand pt's concerns. MD ( Dr. Karma Greaser) notified.  Pt. Was told to given a few minutes and MD will be back in for an update. Pt and pt's husband verbalized understanding.

## 2014-10-25 ENCOUNTER — Ambulatory Visit: Payer: Self-pay | Admitting: Primary Care

## 2014-10-31 DIAGNOSIS — I071 Rheumatic tricuspid insufficiency: Secondary | ICD-10-CM | POA: Insufficient documentation

## 2014-10-31 DIAGNOSIS — R0681 Apnea, not elsewhere classified: Secondary | ICD-10-CM | POA: Insufficient documentation

## 2014-10-31 DIAGNOSIS — I480 Paroxysmal atrial fibrillation: Secondary | ICD-10-CM | POA: Diagnosis not present

## 2014-10-31 DIAGNOSIS — I351 Nonrheumatic aortic (valve) insufficiency: Secondary | ICD-10-CM | POA: Insufficient documentation

## 2014-10-31 DIAGNOSIS — I340 Nonrheumatic mitral (valve) insufficiency: Secondary | ICD-10-CM | POA: Insufficient documentation

## 2014-10-31 DIAGNOSIS — I25118 Atherosclerotic heart disease of native coronary artery with other forms of angina pectoris: Secondary | ICD-10-CM | POA: Diagnosis not present

## 2014-10-31 DIAGNOSIS — R001 Bradycardia, unspecified: Secondary | ICD-10-CM | POA: Insufficient documentation

## 2014-10-31 DIAGNOSIS — R0602 Shortness of breath: Secondary | ICD-10-CM | POA: Diagnosis not present

## 2014-10-31 DIAGNOSIS — R002 Palpitations: Secondary | ICD-10-CM | POA: Diagnosis not present

## 2014-11-08 DIAGNOSIS — R001 Bradycardia, unspecified: Secondary | ICD-10-CM | POA: Diagnosis not present

## 2014-11-10 DIAGNOSIS — F068 Other specified mental disorders due to known physiological condition: Secondary | ICD-10-CM | POA: Diagnosis not present

## 2014-11-10 DIAGNOSIS — R5383 Other fatigue: Secondary | ICD-10-CM | POA: Diagnosis not present

## 2014-11-14 DIAGNOSIS — R5383 Other fatigue: Secondary | ICD-10-CM | POA: Diagnosis not present

## 2014-11-14 DIAGNOSIS — F068 Other specified mental disorders due to known physiological condition: Secondary | ICD-10-CM | POA: Diagnosis not present

## 2014-11-14 DIAGNOSIS — R52 Pain, unspecified: Secondary | ICD-10-CM | POA: Diagnosis not present

## 2014-11-17 ENCOUNTER — Other Ambulatory Visit: Payer: Self-pay

## 2014-11-27 DIAGNOSIS — R0602 Shortness of breath: Secondary | ICD-10-CM | POA: Diagnosis not present

## 2014-11-27 DIAGNOSIS — I25118 Atherosclerotic heart disease of native coronary artery with other forms of angina pectoris: Secondary | ICD-10-CM | POA: Diagnosis not present

## 2014-11-28 ENCOUNTER — Ambulatory Visit: Payer: Medicare Other | Admitting: Family Medicine

## 2014-12-13 ENCOUNTER — Other Ambulatory Visit: Payer: Self-pay

## 2014-12-13 MED ORDER — LISINOPRIL 2.5 MG PO TABS: 2.5000 mg | ORAL_TABLET | Freq: Every day | ORAL | Status: DC

## 2014-12-13 NOTE — Telephone Encounter (Signed)
Creatinine, gfr, and K+ from September 18, 2014 reviewed; approved

## 2015-02-08 DIAGNOSIS — F068 Other specified mental disorders due to known physiological condition: Secondary | ICD-10-CM | POA: Diagnosis not present

## 2015-02-08 DIAGNOSIS — J42 Unspecified chronic bronchitis: Secondary | ICD-10-CM | POA: Diagnosis not present

## 2015-02-08 DIAGNOSIS — J449 Chronic obstructive pulmonary disease, unspecified: Secondary | ICD-10-CM | POA: Diagnosis not present

## 2015-02-10 DIAGNOSIS — R5383 Other fatigue: Secondary | ICD-10-CM | POA: Diagnosis not present

## 2015-02-10 DIAGNOSIS — F068 Other specified mental disorders due to known physiological condition: Secondary | ICD-10-CM | POA: Diagnosis not present

## 2015-03-09 DIAGNOSIS — F068 Other specified mental disorders due to known physiological condition: Secondary | ICD-10-CM | POA: Diagnosis not present

## 2015-03-09 DIAGNOSIS — R52 Pain, unspecified: Secondary | ICD-10-CM | POA: Diagnosis not present

## 2015-03-16 DIAGNOSIS — M19012 Primary osteoarthritis, left shoulder: Secondary | ICD-10-CM | POA: Diagnosis not present

## 2015-03-16 DIAGNOSIS — M5030 Other cervical disc degeneration, unspecified cervical region: Secondary | ICD-10-CM | POA: Diagnosis not present

## 2015-03-28 DIAGNOSIS — J209 Acute bronchitis, unspecified: Secondary | ICD-10-CM | POA: Diagnosis not present

## 2015-04-02 ENCOUNTER — Other Ambulatory Visit: Payer: Self-pay | Admitting: Family Medicine

## 2015-04-02 NOTE — Telephone Encounter (Signed)
Routing to provider  

## 2015-04-02 NOTE — Telephone Encounter (Signed)
Please let FERNE ELLINGWOOD know that I'd like to see patient for an appointment here in the office for:  High blood pressure, thyroid memory Please schedule a visit with me  in the next: month Fasting?  Not necessary Thank you, Dr. Sanda Klein I sent Rx for thyroid med as requested

## 2015-04-02 NOTE — Telephone Encounter (Signed)
Left2x vm to call us back and schedule a f/u appt.

## 2015-04-04 ENCOUNTER — Other Ambulatory Visit: Payer: Self-pay | Admitting: Family Medicine

## 2015-04-04 NOTE — Telephone Encounter (Signed)
Patient needs appt, message sent a few days ago Sending refills

## 2015-04-04 NOTE — Telephone Encounter (Signed)
Routing to provider  

## 2015-04-09 ENCOUNTER — Encounter: Payer: Self-pay | Admitting: Family Medicine

## 2015-04-09 ENCOUNTER — Ambulatory Visit (INDEPENDENT_AMBULATORY_CARE_PROVIDER_SITE_OTHER): Payer: Medicare Other | Admitting: Family Medicine

## 2015-04-09 VITALS — BP 164/79 | HR 56 | Temp 97.5°F | Ht 59.75 in | Wt 123.0 lb

## 2015-04-09 DIAGNOSIS — I10 Essential (primary) hypertension: Secondary | ICD-10-CM | POA: Diagnosis not present

## 2015-04-09 DIAGNOSIS — R5383 Other fatigue: Secondary | ICD-10-CM | POA: Diagnosis not present

## 2015-04-09 DIAGNOSIS — E785 Hyperlipidemia, unspecified: Secondary | ICD-10-CM | POA: Diagnosis not present

## 2015-04-09 DIAGNOSIS — R05 Cough: Secondary | ICD-10-CM | POA: Diagnosis not present

## 2015-04-09 DIAGNOSIS — R3129 Other microscopic hematuria: Secondary | ICD-10-CM | POA: Diagnosis not present

## 2015-04-09 DIAGNOSIS — E038 Other specified hypothyroidism: Secondary | ICD-10-CM | POA: Diagnosis not present

## 2015-04-09 DIAGNOSIS — F0390 Unspecified dementia without behavioral disturbance: Secondary | ICD-10-CM

## 2015-04-09 DIAGNOSIS — R634 Abnormal weight loss: Secondary | ICD-10-CM

## 2015-04-09 DIAGNOSIS — R059 Cough, unspecified: Secondary | ICD-10-CM | POA: Insufficient documentation

## 2015-04-09 DIAGNOSIS — J029 Acute pharyngitis, unspecified: Secondary | ICD-10-CM

## 2015-04-09 DIAGNOSIS — N183 Chronic kidney disease, stage 3 (moderate): Secondary | ICD-10-CM | POA: Diagnosis not present

## 2015-04-09 DIAGNOSIS — Z5181 Encounter for therapeutic drug level monitoring: Secondary | ICD-10-CM

## 2015-04-09 NOTE — Assessment & Plan Note (Signed)
Not under ideal control, but patient has no idea what meds she is actually taking; I asked her to please bring her medicine bottles to her appointments

## 2015-04-09 NOTE — Assessment & Plan Note (Addendum)
Check labs and refer to neurologist; do NOT drive; staff notified to call for a ride for her; she does not have a driver's license and can NOT leave the building to drive herself home; refer to neurologist

## 2015-04-09 NOTE — Assessment & Plan Note (Signed)
Check labs 

## 2015-04-09 NOTE — Assessment & Plan Note (Addendum)
CBC in house and a CXR and close f/u; pulse ox today normal; nothing on exam or in her history to actually suggest pneumonia; she apologized again for using that word

## 2015-04-09 NOTE — Assessment & Plan Note (Signed)
She has seen urologist; referred in 2015; she saw them, note from June 2015 reviewed; CT scan done November 14, 2013 reviewed; check urine again today

## 2015-04-09 NOTE — Progress Notes (Signed)
BP 164/79 mmHg  Pulse 56  Temp(Src) 97.5 F (36.4 C)  Ht 4' 11.75" (1.518 m)  Wt 123 lb (55.792 kg)  BMI 24.21 kg/m2  SpO2 97%   Subjective:    Patient ID: Felicia Terry, female    DOB: 03-04-1930, 81 y.o.   MRN: [id number]  HPI: Felicia Terry is a 81 y.o. female  Chief Complaint  Patient presents with  . URI    She thinks she has pneumonia, has been sick 2 weeks. Having weakness, no appetite, little cough, some congestion, thinks she may have had a fever.   She has been sick for a couple of weeks; I asked next if she is coughing, and she looked puzzled and then said "not a lot"; she says she is sick all the time, and I asked her to elaborate; she says she feels badly; she is weak;   I reviewed her weight and she has lost 7 pounds since last visit; she is not trying to diet; appetite is poor  One of the kids cleaning up put her bottle in the trash can; she thinks it eusaham but that's really not close to anything on her med list; she asked her pharmacist and he can't tell what that was; she did not bring her bottles; she does drive; she is always confused she says; she lives with her husband; I told her that I do not want her to drive; she says "didn't you tell me that before?" and I said yes; she says she doesn't go out of town; she says they took her drivers license away from me she says  She had blood in her urine and she does not remember seeing the kidney doctor; no abd pain and no back pain  Relevant past medical, surgical, family and social history reviewed and updated as indicated. Interim medical history since our last visit reviewed. Allergies and medications reviewed and updated.  Review of Systems  Constitutional: Positive for fatigue and unexpected weight change. Negative for fever.  HENT: Positive for sore throat. Negative for ear pain.   Respiratory: Positive for cough and shortness of breath (maybe sometimes).   Cardiovascular: Negative for palpitations.   Gastrointestinal: Positive for blood in stool (little bit of blood in her stool). Negative for abdominal pain.  Endocrine: Positive for polydipsia.  Genitourinary: Negative for hematuria.  Musculoskeletal: Negative for back pain.  Skin:       No worrisome moles  Psychiatric/Behavioral: Positive for confusion.  Per HPI unless specifically indicated above     Objective:    BP 164/79 mmHg  Pulse 56  Temp(Src) 97.5 F (36.4 C)  Ht 4' 11.75" (1.518 m)  Wt 123 lb (55.792 kg)  BMI 24.21 kg/m2  SpO2 97%  Wt Readings from Last 3 Encounters:  04/09/15 123 lb (55.792 kg)  10/23/14 130 lb (58.968 kg)  Recheck BP 164/79  Physical Exam  Constitutional: She appears well-developed and well-nourished.  Neurological: She displays no tremor.  Year: 2016, month: October, oh, I see, Oc-something Date: last one, the [place]: [facility] President: ... obama Registration: apple, penny, chair World, backwards: "dlo something w" No ifs, ands, or buts Serial 7s: ..., 10, okay... I don't know, something 9 or something 8, "I never was good at arithmatic" Recall: apple, ... "Something with a p", "nope"  Skin: Skin is warm. No ecchymosis and no rash noted. She is not diaphoretic. No pallor.  No palmar erythema; mild nailbed pallor relative to examiner  Psychiatric: Her speech is normal. Her mood appears not anxious. Cognition and memory are impaired. She does not exhibit a depressed mood. She exhibits abnormal recent memory.   Results for orders placed or performed during the hospital encounter of 10/23/14  CBC  Result Value Ref Range   WBC 5.8 3.6 - 11.0 K/uL   RBC 4.56 3.80 - 5.20 MIL/uL   Hemoglobin 15.0 12.0 - 16.0 g/dL   HCT 44.4 35.0 - 47.0 %   MCV 97.4 80.0 - 100.0 fL   MCH 32.8 26.0 - 34.0 pg   MCHC 33.7 32.0 - 36.0 g/dL   RDW 13.9 11.5 - 14.5 %   Platelets 155 150 - 440 K/uL  Basic metabolic panel  Result Value Ref Range   Sodium 141 135 - 145 mmol/L    Potassium 3.8 3.5 - 5.1 mmol/L   Chloride 104 101 - 111 mmol/L   CO2 29 22 - 32 mmol/L   Glucose, Bld 123 (H) 65 - 99 mg/dL   BUN 18 6 - 20 mg/dL   Creatinine, Ser 0.97 0.44 - 1.00 mg/dL   Calcium 9.3 8.9 - 10.3 mg/dL   GFR calc non Af Amer 53 (L) >60 mL/min   GFR calc Af Amer >60 >60 mL/min   Anion gap 8 5 - 15  Troponin I  Result Value Ref Range   Troponin I <0.03 <0.031 ng/mL  Urine today: 1+ hematuria, 3-10 RBCs/hfp CBC showed macrocytosis, see copy in labs; she is taking her B12     Assessment & Plan:   Problem List Items Addressed This Visit      Cardiovascular and Mediastinum   Essential hypertension, benign    Not under ideal control, but patient has no idea what meds she is actually taking; I asked her to please bring her medicine bottles to her appointments      Relevant Medications   propafenone (RYTHMOL SR) 225 MG 12 hr capsule     Endocrine   Other specified hypothyroidism    Check TSH today and adjust dose if needed      Relevant Orders   TSH     Nervous and Auditory   Dementia    Check labs and refer to neurologist; do NOT drive; staff notified to call for a ride for her; she does not have a driver's license and can NOT leave the building to drive herself home; refer to neurologist      Relevant Orders   Vitamin B12   Vit D  25 hydroxy (rtn osteoporosis monitoring)     Genitourinary   Hematuria, microscopic    She has seen urologist; referred in 2015; she saw them, note from June 2015 reviewed; CT scan done November 14, 2013 reviewed; check urine again today      Relevant Orders   UA/M w/rflx Culture, Routine     Other   Fatigue - Primary    Check labs      Relevant Orders   CBC With Differential/Platelet   Abnormal weight loss    Check labs today; discussed reasons for weight loss including thyroid disease, cancer, insufficient calories, etc.; CXR and labs today and close f/u      Relevant Orders   TSH   DG Chest 2 View   CBC With  Differential/Platelet   Sore throat    Check CBC      Relevant Orders   CBC With Differential/Platelet   CBC With Differential/Platelet   Cough    CBC  in house and a CXR and close f/u; pulse ox today normal; nothing on exam or in her history to actually suggest pneumonia; she apologized again for using that word      Relevant Orders   CBC With Differential/Platelet   DG Chest 2 View   CBC With Differential/Platelet   Dyslipidemia   Relevant Orders   Lipid Panel w/o Chol/HDL Ratio   Medication monitoring encounter    Check labs today      Relevant Orders   Comprehensive metabolic panel   TSH    discussed driving issues, memory, hematuria with husband who came to pick her up; explained weight loss could be cancer, and we must work this up  Follow up plan: Return 3-4 days, for fatigue, weight loss.   Orders Placed This Encounter  Procedures  . DG Chest 2 View  . UA/M w/rflx Culture, Routine  . CBC With Differential/Platelet  . Comprehensive metabolic panel  . Lipid Panel w/o Chol/HDL Ratio  . TSH  . Vitamin B12  . Vit D  25 hydroxy (rtn osteoporosis monitoring)  . CBC With Differential/Platelet

## 2015-04-09 NOTE — Assessment & Plan Note (Signed)
Check CBC 

## 2015-04-09 NOTE — Patient Instructions (Addendum)
You cannot drive... Period If you do not have a driver's license, it is against the law for you to drive a car You need to have someone else drive you to and from your appointments here We'll get some labs today and see you back later this week to go over them and continue working up your many symptoms Please have a chest xray done today or tomorrow You can have the chest xray done at [facility] Please bring ALL of your medicine bottles to all of your appointments so we know what medicines you are taking Please go back to see your neurologist, Dr. Gurney Maxin, for evaluation and treatment of your memory Please go back to see your urologist at [facility] because you still have blood in your urine

## 2015-04-09 NOTE — Assessment & Plan Note (Signed)
Check TSH today and adjust dose if needed 

## 2015-04-09 NOTE — Assessment & Plan Note (Signed)
Check labs today.

## 2015-04-09 NOTE — Assessment & Plan Note (Signed)
Check labs today; discussed reasons for weight loss including thyroid disease, cancer, insufficient calories, etc.; CXR and labs today and close f/u

## 2015-04-10 ENCOUNTER — Ambulatory Visit
Admission: RE | Admit: 2015-04-10 | Discharge: 2015-04-10 | Disposition: A | Discharge status: Home / Self Care | Payer: Medicare Other | Source: Ambulatory Visit | Attending: Family Medicine | Admitting: Family Medicine

## 2015-04-10 DIAGNOSIS — R634 Abnormal weight loss: Secondary | ICD-10-CM | POA: Diagnosis not present

## 2015-04-10 DIAGNOSIS — R0602 Shortness of breath: Secondary | ICD-10-CM | POA: Diagnosis not present

## 2015-04-10 DIAGNOSIS — R918 Other nonspecific abnormal finding of lung field: Secondary | ICD-10-CM | POA: Diagnosis not present

## 2015-04-10 DIAGNOSIS — R05 Cough: Secondary | ICD-10-CM | POA: Insufficient documentation

## 2015-04-10 LAB — COMPREHENSIVE METABOLIC PANEL
A/G RATIO: 1.8 (ref 1.1–2.5)
ALBUMIN: 4.1 g/dL (ref 3.5–4.7)
ALT: 21 IU/L (ref 0–32)
AST: 24 IU/L (ref 0–40)
Alkaline Phosphatase: 57 IU/L (ref 39–117)
BUN / CREAT RATIO: 17 (ref 11–26)
BUN: 17 mg/dL (ref 8–27)
Bilirubin Total: 0.4 mg/dL (ref 0.0–1.2)
CHLORIDE: 97 mmol/L (ref 97–106)
CO2: 26 mmol/L (ref 18–29)
Calcium: 9.3 mg/dL (ref 8.7–10.3)
Creatinine, Ser: 1 mg/dL (ref 0.57–1.00)
GFR calc Af Amer: 60 mL/min/{1.73_m2} (ref >59)
GFR calc non Af Amer: 52 mL/min/{1.73_m2} — ABNORMAL LOW (ref >59)
Globulin, Total: 2.3 g/dL (ref 1.5–4.5)
Glucose: 83 mg/dL (ref 65–99)
Potassium: 4.2 mmol/L (ref 3.5–5.2)
SODIUM: 139 mmol/L (ref 136–144)
Total Protein: 6.4 g/dL (ref 6.0–8.5)

## 2015-04-10 LAB — UA/M W/RFLX CULTURE, ROUTINE
BILIRUBIN UA: NEGATIVE
Glucose, UA: NEGATIVE
Ketones, UA: NEGATIVE
Leukocytes, UA: NEGATIVE
Nitrite, UA: NEGATIVE
PH UA: 5 (ref 5.0–7.5)
Protein, UA: NEGATIVE
SPEC GRAV UA: 1.015 (ref 1.005–1.030)
Urobilinogen, Ur: 0.2 mg/dL (ref 0.2–1.0)

## 2015-04-10 LAB — CBC WITH DIFFERENTIAL/PLATELET
Hematocrit: 40.7 % (ref 34.0–46.6)
Hemoglobin: 14.3 g/dL (ref 11.1–15.9)
Lymphocytes Absolute: 1.1 10*3/uL (ref 0.7–3.1)
Lymphs: 16 %
MCH: 35 pg — AB (ref 26.6–33.0)
MCHC: 35.1 g/dL (ref 31.5–35.7)
MCV: 100 fL — ABNORMAL HIGH (ref 79–97)
MID (ABSOLUTE): 0.8 10*3/uL (ref 0.1–1.6)
MID: 12 %
NEUTROS ABS: 4.8 10*3/uL (ref 1.4–7.0)
Neutrophils: 72 %
PLATELETS: 162 10*3/uL (ref 150–379)
RBC: 4.08 x10E6/uL (ref 3.77–5.28)
RDW: 14.9 % (ref 12.3–15.4)
WBC: 6.7 10*3/uL (ref 3.4–10.8)

## 2015-04-10 LAB — TSH: TSH: 2.27 u[IU]/mL (ref 0.450–4.500)

## 2015-04-10 LAB — MICROSCOPIC EXAMINATION

## 2015-04-10 LAB — LIPID PANEL W/O CHOL/HDL RATIO
Cholesterol, Total: 182 mg/dL (ref 100–199)
HDL: 76 mg/dL (ref >39)
LDL Calculated: 92 mg/dL (ref 0–99)
Triglycerides: 72 mg/dL (ref 0–149)
VLDL Cholesterol Cal: 14 mg/dL (ref 5–40)

## 2015-04-10 LAB — VITAMIN B12

## 2015-04-10 LAB — VITAMIN D 25 HYDROXY (VIT D DEFICIENCY, FRACTURES): Vit D, 25-Hydroxy: 38.4 ng/mL (ref 30.0–100.0)

## 2015-04-12 ENCOUNTER — Emergency Department
Admission: EM | Admit: 2015-04-12 | Discharge: 2015-04-12 | Discharge status: Against Medical Advice | Payer: Medicare Other | Attending: Emergency Medicine | Admitting: Emergency Medicine

## 2015-04-12 ENCOUNTER — Encounter: Payer: Self-pay | Admitting: *Deleted

## 2015-04-12 ENCOUNTER — Other Ambulatory Visit: Payer: Self-pay

## 2015-04-12 ENCOUNTER — Encounter: Payer: Self-pay | Admitting: Family Medicine

## 2015-04-12 ENCOUNTER — Ambulatory Visit (INDEPENDENT_AMBULATORY_CARE_PROVIDER_SITE_OTHER): Payer: Medicare Other | Admitting: Family Medicine

## 2015-04-12 VITALS — BP 134/76 | HR 64 | Temp 98.7°F | Wt 124.0 lb

## 2015-04-12 DIAGNOSIS — F0390 Unspecified dementia without behavioral disturbance: Secondary | ICD-10-CM

## 2015-04-12 DIAGNOSIS — D692 Other nonthrombocytopenic purpura: Secondary | ICD-10-CM | POA: Insufficient documentation

## 2015-04-12 DIAGNOSIS — D7589 Other specified diseases of blood and blood-forming organs: Secondary | ICD-10-CM

## 2015-04-12 DIAGNOSIS — Z5181 Encounter for therapeutic drug level monitoring: Secondary | ICD-10-CM

## 2015-04-12 DIAGNOSIS — I2510 Atherosclerotic heart disease of native coronary artery without angina pectoris: Secondary | ICD-10-CM | POA: Diagnosis not present

## 2015-04-12 DIAGNOSIS — R000 Tachycardia, unspecified: Secondary | ICD-10-CM | POA: Diagnosis not present

## 2015-04-12 DIAGNOSIS — I10 Essential (primary) hypertension: Secondary | ICD-10-CM

## 2015-04-12 DIAGNOSIS — R3129 Other microscopic hematuria: Secondary | ICD-10-CM | POA: Diagnosis not present

## 2015-04-12 DIAGNOSIS — E038 Other specified hypothyroidism: Secondary | ICD-10-CM

## 2015-04-12 LAB — BASIC METABOLIC PANEL
Anion gap: 5 (ref 5–15)
BUN: 19 mg/dL (ref 6–20)
CHLORIDE: 102 mmol/L (ref 101–111)
CO2: 28 mmol/L (ref 22–32)
Calcium: 9 mg/dL (ref 8.9–10.3)
Creatinine, Ser: 1.14 mg/dL — ABNORMAL HIGH (ref 0.44–1.00)
GFR calc Af Amer: 50 mL/min — ABNORMAL LOW (ref >60)
GFR calc non Af Amer: 43 mL/min — ABNORMAL LOW (ref >60)
GLUCOSE: 122 mg/dL — AB (ref 65–99)
POTASSIUM: 4.4 mmol/L (ref 3.5–5.1)
Sodium: 135 mmol/L (ref 135–145)

## 2015-04-12 LAB — CBC
HCT: 42.3 % (ref 35.0–47.0)
HEMOGLOBIN: 14.6 g/dL (ref 12.0–16.0)
MCH: 34 pg (ref 26.0–34.0)
MCHC: 34.4 g/dL (ref 32.0–36.0)
MCV: 98.7 fL (ref 80.0–100.0)
Platelets: 152 10*3/uL (ref 150–440)
RBC: 4.29 MIL/uL (ref 3.80–5.20)
RDW: 15 % — ABNORMAL HIGH (ref 11.5–14.5)
WBC: 6 10*3/uL (ref 3.6–11.0)

## 2015-04-12 LAB — TROPONIN I: Troponin I: <0.03 ng/mL (ref <0.031)

## 2015-04-12 MED ORDER — VITAMIN B-12 1000 MCG PO TABS: 1000.0000 ug | ORAL_TABLET | ORAL | Status: DC

## 2015-04-12 NOTE — Patient Instructions (Addendum)
You only need to take extra vitamin B12 once a week Continue all of your other pills Please do call and schedule an appointment with Dr. Melrose Nakayama for neurologist and the urologist at [facility] schedule a scan of your abdomen and pelvis Please do return the stool cards at your earliest convenience Return in 2 weeks

## 2015-04-12 NOTE — ED Notes (Addendum)
Patient is stating that she wants to go home and that her heart "feels better." 20 minute converstation to explain to patient that she needed to stay and be seen by a doctor. Patient wants to go home anyway.

## 2015-04-12 NOTE — ED Notes (Signed)
Pt reports she was in bed and heart starting beating fast.  No chest pain  No sob.  Pt states it feels like it is pounding in my chest.  No n/v/d.   Pt alert.  Skin warm and dry.

## 2015-04-12 NOTE — Progress Notes (Signed)
BP 134/76 mmHg  Pulse 64  Temp(Src) 98.7 F (37.1 C)  Wt 124 lb (56.246 kg)  SpO2 97%   Subjective:    Patient ID: Felicia Terry, female    DOB: 1930-04-29, 81 y.o.   MRN: [id number]  HPI: Felicia Terry is a 81 y.o. female  Chief Complaint  Patient presents with  . Follow-up    She states she is about the same, feels ok in the morning but as the day goes on she feels weaker.  She is here with her husband She does not go out to eat because she doesn't want to do anything She is afraid if she does too much activity, the more likely she will relapse She feels okay most of hte time when she gets up, but gets tired around 10 or 11 am; she pushes herself to keep going She went outside last night with no shoes or socks on looking for her cat; that was unusual that her cat was gone; that's not typical No appetite lately She does not have an appt yet with urologist She does not see any blood in the urine No abdominal or flank or back pain; sometimes has back pain she then says, but she doesn't listen and overdoes it   CLINICAL DATA: Abnormal weight loss for several months, no appetite, some congestion and shortness of breath  EXAM: CHEST 2 VIEW  COMPARISON: Chest x-ray of 10/23/2014  FINDINGS: The lungs are clear but somewhat hyperaerated. There is increased AP diameter and somewhat flattened hemidiaphragms suggesting an element of emphysema. Mediastinal and hilar contours are unremarkable. The heart is within normal limits in size. No bony abnormality is seen.  IMPRESSION: No definite active process. Probable emphysema.  She did not grow with smokers No hx of work in [employer], but worked tobacco  Relevant past medical, surgical, family and social history reviewed and updated as indicated. Interim medical history since our last visit reviewed. Allergies and medications reviewed and updated. She has a B12 level >2000; she is taking OTC B12 and her multiple  vitamin has B12 in it as well Her CCB was filled Nov 2015; she says she is pouring new medicine in old bottles Her aspirin is expired, Feb 2015  Review of Systems Per HPI unless specifically indicated above     Objective:    BP 134/76 mmHg  Pulse 64  Temp(Src) 98.7 F (37.1 C)  Wt 124 lb (56.246 kg)  SpO2 97%  Wt Readings from Last 3 Encounters:  04/12/15 125 lb (56.7 kg)  04/12/15 124 lb (56.246 kg)  04/09/15 123 lb (55.792 kg)    Physical Exam  Constitutional: She appears well-developed and well-nourished.  Cardiovascular: Normal rate and regular rhythm.   Pulmonary/Chest: Effort normal and breath sounds normal.  Neurological: She displays no tremor.  Skin: Skin is warm. She is not diaphoretic. No pallor.  No palmar erythema; senile purpura noted on the sun-exposed parts of both forearms and on the upper chest  Psychiatric: Her speech is normal. Her mood appears not anxious. Cognition and memory are impaired. She does not exhibit a depressed mood. She exhibits abnormal recent memory.    Results for orders placed or performed in visit on 04/09/15  Microscopic Examination  Result Value Ref Range   WBC, UA 0-5 0 -  5 /hpf   RBC, UA 3-10 (A) 0 -  2 /hpf   Epithelial Cells (non renal) 0-10 0 - 10 /hpf   Mucus, UA Present Not  Estab.   Bacteria, UA Few None seen/Few  UA/M w/rflx Culture, Routine  Result Value Ref Range   Specific Gravity, UA 1.015 1.005 - 1.030   pH, UA 5.0 5.0 - 7.5   Color, UA Yellow Yellow   Appearance Ur Clear Clear   Leukocytes, UA Negative Negative   Protein, UA Negative Negative/Trace   Glucose, UA Negative Negative   Ketones, UA Negative Negative   RBC, UA 1+ (A) Negative   Bilirubin, UA Negative Negative   Urobilinogen, Ur 0.2 0.2 - 1.0 mg/dL   Nitrite, UA Negative Negative   Microscopic Examination See below:   Comprehensive metabolic panel  Result Value Ref Range   Glucose 83 65 - 99 mg/dL   BUN 17 8 - 27 mg/dL   Creatinine, Ser 1.00  0.57 - 1.00 mg/dL   GFR calc non Af Amer 52 (L) >59 mL/min/1.73   GFR calc Af Amer 60 >59 mL/min/1.73   BUN/Creatinine Ratio 17 11 - 26   Sodium 139 136 - 144 mmol/L   Potassium 4.2 3.5 - 5.2 mmol/L   Chloride 97 97 - 106 mmol/L   CO2 26 18 - 29 mmol/L   Calcium 9.3 8.7 - 10.3 mg/dL   Total Protein 6.4 6.0 - 8.5 g/dL   Albumin 4.1 3.5 - 4.7 g/dL   Globulin, Total 2.3 1.5 - 4.5 g/dL   Albumin/Globulin Ratio 1.8 1.1 - 2.5   Bilirubin Total 0.4 0.0 - 1.2 mg/dL   Alkaline Phosphatase 57 39 - 117 IU/L   AST 24 0 - 40 IU/L   ALT 21 0 - 32 IU/L  Lipid Panel w/o Chol/HDL Ratio  Result Value Ref Range   Cholesterol, Total 182 100 - 199 mg/dL   Triglycerides 72 0 - 149 mg/dL   HDL 76 >39 mg/dL   VLDL Cholesterol Cal 14 5 - 40 mg/dL   LDL Calculated 92 0 - 99 mg/dL  TSH  Result Value Ref Range   TSH 2.270 0.450 - 4.500 uIU/mL  Vitamin B12  Result Value Ref Range   Vitamin B-12 >2000 (H) 211 - 946 pg/mL  Vit D  25 hydroxy (rtn osteoporosis monitoring)  Result Value Ref Range   Vit D, 25-Hydroxy 38.4 30.0 - 100.0 ng/mL  CBC With Differential/Platelet  Result Value Ref Range   WBC 6.7 3.4 - 10.8 x10E3/uL   RBC 4.08 3.77 - 5.28 x10E6/uL   Hemoglobin 14.3 11.1 - 15.9 g/dL   Hematocrit 40.7 34.0 - 46.6 %   MCV 100 (H) 79 - 97 fL   MCH 35.0 (H) 26.6 - 33.0 pg   MCHC 35.1 31.5 - 35.7 g/dL   RDW 14.9 12.3 - 15.4 %   Platelets 162 150 - 379 x10E3/uL   Neutrophils 72 %   Lymphs 16 %   MID 12 %   Neutrophils Absolute 4.8 1.4 - 7.0 x10E3/uL   Lymphocytes Absolute 1.1 0.7 - 3.1 x10E3/uL   MID (Absolute) 0.8 0.1 - 1.6 X10E3/uL  Protein electrophoresis, serum  Result Value Ref Range   Total Protein 6.8 6.0 - 8.5 g/dL  Specimen status report  Result Value Ref Range   specimen status report Comment       Assessment & Plan:   Problem List Items Addressed This Visit      Cardiovascular and Mediastinum   Essential hypertension, benign    Controlled today; so important to stay on  medications; stressed importance of NOT pouring new medicine into old bottles  Senile purpura (HCC)    Last albumin was normal; platelets normal        Endocrine   Other specified hypothyroidism    TSH normal at 2.27; no changes to medicine        Nervous and Auditory   Dementia    Reviewed labs; asked husband to please contact neurologist to get patient an appointment to see her dementia doctor; there may be medicine that can help her I explained        Genitourinary   Hematuria, microscopic    Stressed importance of following up with the urologist for this to patient and her husband; they will call for appointment; I am ordering CT scan in case they delay further in getting appt (I told them to get appt last visit and they had not called yet)      Relevant Orders   CT Abdomen Pelvis W Wo Contrast     Other   Medication monitoring encounter    Stressed importance of keeping medicine in original bottles; do NOT pour new medicine into old bottles      Macrocytosis - Primary    Will get UPEP and SPEP      Relevant Orders   Protein Electrophoresis, Urine Rflx.   Protein Electrophoresis, (serum)       Follow up plan: Return in about 2 weeks (around 04/26/2015) for follow-up.  An after-visit summary was printed and given to the patient at [facility].  Please see the patient instructions which may contain other information and recommendations beyond what is mentioned above in the assessment and plan.  Orders Placed This Encounter  Procedures  . CT Abdomen Pelvis W Wo Contrast  . Protein Electrophoresis, Urine Rflx.  . Protein Electrophoresis, (serum)

## 2015-04-16 ENCOUNTER — Other Ambulatory Visit: Payer: Medicare Other | Admitting: Family Medicine

## 2015-04-16 DIAGNOSIS — R634 Abnormal weight loss: Secondary | ICD-10-CM

## 2015-04-16 LAB — FECAL OCCULT BLOOD, GUAIAC
SPECIMEN 3: NEGATIVE
Specimen 1: NEGATIVE
Specimen 2: NEGATIVE

## 2015-04-16 LAB — SPECIMEN STATUS REPORT

## 2015-04-16 LAB — PROTEIN ELECTROPHORESIS, SERUM
A/G RATIO SPE: 1.1 (ref 0.7–1.7)
Albumin ELP: 3.6 g/dL (ref 2.9–4.4)
Alpha 1: 0.3 g/dL (ref 0.0–0.4)
Alpha 2: 0.9 g/dL (ref 0.4–1.0)
BETA: 1 g/dL (ref 0.7–1.3)
GAMMA GLOBULIN: 1.1 g/dL (ref 0.4–1.8)
Globulin, Total: 3.2 g/dL (ref 2.2–3.9)

## 2015-04-16 LAB — PROTEIN ELECTROPHORESIS, URINE REFLEX
ALBUMIN ELP UR: 29.7 %
Alpha-1-Globulin, U: 3.2 %
Alpha-2-Globulin, U: 18.2 %
Beta Globulin, U: 34.5 %
Gamma Globulin, U: 14.4 %
PROTEIN UR: 28.3 mg/dL

## 2015-04-16 NOTE — Assessment & Plan Note (Signed)
Reviewed labs; asked husband to please contact neurologist to get patient an appointment to see her dementia doctor; there may be medicine that can help her I explained

## 2015-04-16 NOTE — Assessment & Plan Note (Signed)
TSH normal at 2.27; no changes to medicine

## 2015-04-16 NOTE — Assessment & Plan Note (Signed)
Stressed importance of keeping medicine in original bottles; do NOT pour new medicine into old bottles

## 2015-04-16 NOTE — Assessment & Plan Note (Signed)
Will get UPEP and SPEP

## 2015-04-16 NOTE — Assessment & Plan Note (Addendum)
Stressed importance of following up with the urologist for this to patient and her husband; they will call for appointment; I am ordering CT scan in case they delay further in getting appt (I told them to get appt last visit and they had not called yet)

## 2015-04-16 NOTE — Assessment & Plan Note (Signed)
Last albumin was normal; platelets normal

## 2015-04-16 NOTE — Assessment & Plan Note (Signed)
Controlled today; so important to stay on medications; stressed importance of NOT pouring new medicine into old bottles

## 2015-04-23 ENCOUNTER — Telehealth: Payer: Self-pay

## 2015-04-23 NOTE — Telephone Encounter (Signed)
Appointment made for patient's CT of Abdomen for Thursday 04/26/2015 at 1:30 at [facility] outpatient imaging center.  Patient called and notified of appointment and to arrive at 1:15.

## 2015-04-25 NOTE — Progress Notes (Signed)
Patient notified

## 2015-04-26 ENCOUNTER — Ambulatory Visit
Admission: RE | Admit: 2015-04-26 | Discharge: 2015-04-26 | Disposition: A | Discharge status: Home / Self Care | Payer: Medicare Other | Source: Ambulatory Visit | Attending: Family Medicine | Admitting: Family Medicine

## 2015-04-26 DIAGNOSIS — K5900 Constipation, unspecified: Secondary | ICD-10-CM | POA: Diagnosis not present

## 2015-04-26 DIAGNOSIS — K4020 Bilateral inguinal hernia, without obstruction or gangrene, not specified as recurrent: Secondary | ICD-10-CM | POA: Insufficient documentation

## 2015-04-26 DIAGNOSIS — R3129 Other microscopic hematuria: Secondary | ICD-10-CM

## 2015-04-26 DIAGNOSIS — K5730 Diverticulosis of large intestine without perforation or abscess without bleeding: Secondary | ICD-10-CM | POA: Diagnosis not present

## 2015-04-26 DIAGNOSIS — K5710 Diverticulosis of small intestine without perforation or abscess without bleeding: Secondary | ICD-10-CM | POA: Diagnosis not present

## 2015-04-26 MED ORDER — IOHEXOL 300 MG/ML  SOLN
100.0000 mL | Freq: Once | INTRAMUSCULAR | Status: AC | PRN
  Administered 2015-04-26: 100 mL via INTRAVENOUS

## 2015-04-30 ENCOUNTER — Encounter: Payer: Self-pay | Admitting: Family Medicine

## 2015-04-30 ENCOUNTER — Ambulatory Visit (INDEPENDENT_AMBULATORY_CARE_PROVIDER_SITE_OTHER): Payer: Medicare Other | Admitting: Family Medicine

## 2015-04-30 VITALS — BP 145/80 | HR 61 | Temp 97.8°F | Wt 122.0 lb

## 2015-04-30 DIAGNOSIS — R059 Cough, unspecified: Secondary | ICD-10-CM

## 2015-04-30 DIAGNOSIS — K314 Gastric diverticulum: Secondary | ICD-10-CM

## 2015-04-30 DIAGNOSIS — R634 Abnormal weight loss: Secondary | ICD-10-CM

## 2015-04-30 DIAGNOSIS — Z1239 Encounter for other screening for malignant neoplasm of breast: Secondary | ICD-10-CM

## 2015-04-30 DIAGNOSIS — K4020 Bilateral inguinal hernia, without obstruction or gangrene, not specified as recurrent: Secondary | ICD-10-CM

## 2015-04-30 DIAGNOSIS — R05 Cough: Secondary | ICD-10-CM | POA: Diagnosis not present

## 2015-04-30 DIAGNOSIS — Q762 Congenital spondylolisthesis: Secondary | ICD-10-CM | POA: Diagnosis not present

## 2015-04-30 DIAGNOSIS — K458 Other specified abdominal hernia without obstruction or gangrene: Secondary | ICD-10-CM | POA: Diagnosis not present

## 2015-04-30 DIAGNOSIS — M4310 Spondylolisthesis, site unspecified: Secondary | ICD-10-CM

## 2015-04-30 NOTE — Progress Notes (Signed)
BP 145/80 mmHg  Pulse 61  Temp(Src) 97.8 F (36.6 C)  Wt 122 lb (55.339 kg)  SpO2 97%   Subjective:    Patient ID: Felicia Terry, female    DOB: [birth year], 81 y.o.   MRN: [id number]  HPI: Felicia Terry is a 81 y.o. female  Chief Complaint  Patient presents with  . Follow-up    2 week follow up, discuss CT results   She is here with her husband to go over CT scan results in detail  She has been under fair amount of stress at home; she has one grandson that she wants to kick out of the house; another grandson finished college, works part-time; having additional people in the home creates some stress in her life; she is not depressed when asked if that might be cause for weight loss  She said she had the round thing that has blood...the heart? I asked as she had difficulty starting her history, and she replied yes she was trying to talk about her heart; she has some fast heart beat and a little shortness of breath; she has a cardiologist; we reviewed history together  She is here to go over the abd/pelvic CT in detail; we reviewed that detailed She is taking Miralax, taking regularly  Impression from CT scan performed Apr 26, 2015 IMPRESSION: 1. No cause for hematuria identified. 2. Considerable pelvic floor laxity, with herniation of loops of small bowel and low position of the anorectal junction. 3. Severe central narrowing of the thecal sac at L4-5 secondary to underlying 10 mm of degenerative anterolisthesis, degenerative disc disease, and ligamentum flavum redundancy. 4. Several somewhat prominent periampullary duodenal diverticula. There is also prominent sigmoid diverticulosis without active diverticulitis. 5. Prominent stool throughout the colon favors constipation. 6. Aortoiliac atherosclerotic vascular disease. 7. Scattered hepatic fluid density lesions favor cysts and appear stable. 8. Bilateral inguinal hernias contain adipose tissue.  She had a lower  pelvic reconstruction several years ago  Relevant past medical, surgical, family and social history reviewed and updated as indicated. Interim medical history since our last visit reviewed. Allergies and medications reviewed and updated.  Review of Systems  Constitutional: Positive for unexpected weight change (still losing weight).  Respiratory: Positive for cough (bringing up phlegm; no set pattern; no after eating) and shortness of breath. Negative for wheezing.   Cardiovascular: Positive for palpitations.  Gastrointestinal: Positive for nausea. Negative for abdominal pain.  Endocrine: Negative for polyuria.  Genitourinary: Negative for dysuria, frequency, hematuria and pelvic pain.  Skin:       Dry skin  no night sweats Has had some shadows under the eyes; vision is okay Per HPI unless specifically indicated above     Objective:    BP 145/80 mmHg  Pulse 61  Temp(Src) 97.8 F (36.6 C)  Wt 122 lb (55.339 kg)  SpO2 97%  Wt Readings from Last 3 Encounters:  04/30/15 122 lb (55.339 kg)  04/12/15 125 lb (56.7 kg)  04/12/15 124 lb (56.246 kg)    Physical Exam  Constitutional: She appears well-developed and well-nourished. No distress.  Eyes: EOM are normal. No scleral icterus.  Neck: No thyromegaly present.  Cardiovascular: Normal rate.   Pulmonary/Chest: Effort normal.  Abdominal: She exhibits no distension.  Skin: No pallor.  Psychiatric: She has a normal mood and affect. Judgment normal. Her speech is not delayed and not slurred. She is not agitated, not aggressive, not slowed and not withdrawn. Cognition and memory are impaired. She exhibits abnormal  recent memory.   Results for orders placed or performed in visit on 04/16/15  [facility] Stool Card-TAKE HOME  Result Value Ref Range   Specimen 1 neg    Specimen 2 neg    Specimen 3 neg       Assessment & Plan:   Problem List Items Addressed This Visit      Musculoskeletal and Integument   Hernia of pelvic floor -  Primary    See CT scan, refer to surgeon to see if anything surgical needed or pessary or pelvic floor physical therapy; prevent constipation; continue miralax; reasons to seek immediate care reviewed      Relevant Orders   Ambulatory referral to General Surgery     Other   Abnormal weight loss    She has lost two to three more pounds this month already; no cancer found on abd/pelvis CT; will get chest CT      Relevant Orders   CT Chest W Contrast   Ambulatory referral to General Surgery   Cough    CXR; 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; will get chest CT      Relevant Orders   CT Chest W Contrast   Ambulatory referral to General Surgery   Breast cancer screening   Relevant Orders   MM DIGITAL SCREENING BILATERAL    Other Visit Diagnoses    Anterolisthesis        10 mm on CT scan Nov 2016; refer to neurosurgery    Relevant Orders    Ambulatory referral to Neurosurgery    Diverticulum of stomach        Relevant Orders    Ambulatory referral to General Surgery    Bilateral inguinal hernia without obstruction or gangrene, recurrence not specified        Relevant Orders    Ambulatory referral to General Surgery       Follow up plan: Return in about 3 weeks (around 05/21/2015) for follow-up, call sooner if needed.  An after-visit summary was printed and given to the patient at [facility].  Please see the patient instructions which may contain other information and recommendations beyond what is mentioned above in the assessment and plan.  Face-to-face time with patient was more than 25 minutes, >50% time spent counseling and coordination of care  Orders Placed This Encounter  Procedures  . CT Chest W Contrast  . MM DIGITAL SCREENING BILATERAL  . Ambulatory referral to Neurosurgery  . Ambulatory referral to General Surgery

## 2015-04-30 NOTE — Assessment & Plan Note (Signed)
She has lost two to three more pounds this month already; no cancer found on abd/pelvis CT; will get chest CT

## 2015-04-30 NOTE — Patient Instructions (Addendum)
We'll have you see a general surgeon about the hernia in your pelvis, the diverticula in your stomach If you develop abdominal or pelvic pain, nausea / vomiting, etc, then go right to the emergency room in case the hernia is twisted We'll have you see a neurosurgeon (back surgeon) about the abnormalities on your scan in your lower back If you ever develop loss of control of bowel or bladder or you can't feel your legs, then call 911 and get to the hospital immediately Keep using the Miralax and try to drink plenty of water and get more fiber We'll get a chest CT to look at your lungs Do call your cardiologist about your heart questions or symptoms Please do call to schedule your mammogram; the number to schedule one at either [facility] or [facility] is [phone] Please do call your urologist and schedule an appointment about the blood in your urine, [phone] Call with any new symptoms

## 2015-04-30 NOTE — Assessment & Plan Note (Signed)
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

## 2015-04-30 NOTE — Assessment & Plan Note (Signed)
See CT scan, refer to surgeon to see if anything surgical needed or pessary or pelvic floor physical therapy; prevent constipation; continue miralax; reasons to seek immediate care reviewed

## 2015-05-07 ENCOUNTER — Other Ambulatory Visit: Payer: Self-pay | Admitting: Family Medicine

## 2015-05-14 ENCOUNTER — Other Ambulatory Visit: Payer: Self-pay | Admitting: Family Medicine

## 2015-05-14 NOTE — Telephone Encounter (Signed)
Routing to provider  

## 2015-05-15 ENCOUNTER — Encounter: Payer: Self-pay | Admitting: Family Medicine

## 2015-05-15 DIAGNOSIS — I2510 Atherosclerotic heart disease of native coronary artery without angina pectoris: Secondary | ICD-10-CM | POA: Insufficient documentation

## 2015-05-15 DIAGNOSIS — E782 Mixed hyperlipidemia: Secondary | ICD-10-CM | POA: Insufficient documentation

## 2015-05-15 DIAGNOSIS — R000 Tachycardia, unspecified: Secondary | ICD-10-CM | POA: Insufficient documentation

## 2015-05-15 DIAGNOSIS — E785 Hyperlipidemia, unspecified: Secondary | ICD-10-CM | POA: Insufficient documentation

## 2015-05-15 DIAGNOSIS — I10 Essential (primary) hypertension: Secondary | ICD-10-CM | POA: Insufficient documentation

## 2015-05-15 NOTE — Telephone Encounter (Signed)
Last hosp labs reviewed; appt next week

## 2015-05-16 DIAGNOSIS — I480 Paroxysmal atrial fibrillation: Secondary | ICD-10-CM | POA: Diagnosis not present

## 2015-05-16 DIAGNOSIS — R001 Bradycardia, unspecified: Secondary | ICD-10-CM | POA: Diagnosis not present

## 2015-05-16 DIAGNOSIS — I351 Nonrheumatic aortic (valve) insufficiency: Secondary | ICD-10-CM | POA: Diagnosis not present

## 2015-05-16 DIAGNOSIS — I2510 Atherosclerotic heart disease of native coronary artery without angina pectoris: Secondary | ICD-10-CM | POA: Diagnosis not present

## 2015-05-17 ENCOUNTER — Ambulatory Visit (INDEPENDENT_AMBULATORY_CARE_PROVIDER_SITE_OTHER): Payer: Medicare Other | Admitting: General Surgery

## 2015-05-17 ENCOUNTER — Encounter: Payer: Self-pay | Admitting: General Surgery

## 2015-05-17 VITALS — BP 145/72 | HR 50 | Temp 97.4°F | Ht 63.0 in | Wt 122.0 lb

## 2015-05-17 DIAGNOSIS — K4020 Bilateral inguinal hernia, without obstruction or gangrene, not specified as recurrent: Secondary | ICD-10-CM

## 2015-05-17 NOTE — Patient Instructions (Signed)
Please give [place] a call if you have any pain from your groins that way we could schedule an appointment.

## 2015-05-17 NOTE — Progress Notes (Signed)
Patient ID: Felicia Terry, female   DOB: Oct 29, 1929, 81 y.o.   MRN: [id number]  CC: Hernia  HPI Felicia Terry is a 81 y.o. female presents to clinic for evaluation of bilateral inguinal hernias. Patient initially stated she didn't know why she was here. She had hernias discovered incidentally on a CT scan that was ordered for workup of hematuria. Patient denies any history of lower abdominal pain. She is unaware of her hernias current. Patient also with a diagnosis from CT scan of small bowel diverticuli. She denies any abdominal pain, nausea, vomiting, chest pain, short of breath, diarrhea, constipation. She is very pleasant also obviously confused.  HPI  Past Medical History  Diagnosis Date  . Hypertension   . 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 Coronary artery disease     cardiac stent    Past Surgical History  Procedure Laterality Date  . Abdominal hysterectomy      Family History  Problem Relation Age of Onset  . Diabetes Mother   . Cancer Neg Hx   . Heart disease Neg Hx   . Stroke Neg Hx     Social History Social History  Substance Use Topics  . Smoking status: Never Smoker   . Smokeless tobacco: Never Used  . Alcohol Use: No    No Known Allergies  Current Outpatient Prescriptions  Medication Sig Dispense Refill  . aspirin EC 81 MG tablet Take 81 mg by mouth daily.    Marland Kitchen diltiazem (CARDIZEM) 30 MG tablet Take 1 tablet by mouth 2 (two) times daily.    Marland Kitchen esomeprazole (NEXIUM) 40 MG capsule Take 40 mg by mouth daily.    Marland Kitchen levothyroxine (SYNTHROID, LEVOTHROID) 25 MCG tablet TAKE 1 TABLET DAILY 90 tablet 0  . lisinopril (PRINIVIL,ZESTRIL) 2.5 MG tablet Take 1 tablet (2.5 mg total) by mouth daily. 30 tablet 0  . meloxicam (MOBIC) 7.5 MG tablet Take 7.5 mg by mouth daily.    . Multiple Vitamin (MULTI-VITAMINS) TABS Take by mouth daily.    . polyethylene glycol (MIRALAX / GLYCOLAX) packet Take 17 g by mouth every other day.    . pravastatin (PRAVACHOL) 40 MG tablet 1  Tablet daily ORAL 30 tablet 0  . propafenone (RYTHMOL SR) 225 MG 12 hr capsule Take 225 mg by mouth 2 (two) times daily.     . vitamin B-12 (CYANOCOBALAMIN) 1000 MCG tablet Take 1 tablet (1,000 mcg total) by mouth once a week. 4 tablet 11   No current facility-administered medications for this visit.     Review of Systems A multi-point review of systems was asked and was negative except for the positive findings documented in the history of present illness  Physical Exam Blood pressure 145/72, pulse 50, temperature 97.4 F (36.3 C), temperature source Oral, height 5\' 3"  (1.6 m), weight 55.339 kg (122 lb). CONSTITUTIONAL: No acute distress. EYES: Pupils are equal, round, and reactive to light, Sclera are non-icteric. EARS, NOSE, MOUTH AND THROAT: The oropharynx is clear. The oral mucosa is pink and moist. Hearing is intact to voice. LYMPH NODES:  Lymph nodes in the neck are normal. RESPIRATORY:  Lungs are clear. There is normal respiratory effort, with equal breath sounds bilaterally, and without pathologic use of accessory muscles. CARDIOVASCULAR: Heart is regular without murmurs, gallops, or rubs. GI: The abdomen is slender, soft, nontender, and nondistended. There are palpable bilateral inguinal hernias. They are easily reducible and minimally tender on reduction.. There is no hepatosplenomegaly. There are normal bowel sounds in all  quadrants. GU: Rectal deferred.   MUSCULOSKELETAL: Normal muscle strength and tone. No cyanosis or edema.   SKIN: Turgor is good and there are no pathologic skin lesions or ulcers. NEUROLOGIC: Motor and sensation is grossly normal. Cranial nerves are grossly intact. PSYCH:  Oriented to person, place and time. Affect is normal.  Data Reviewed Images and labs reviewed. Images do show the above-mentioned bilateral inguinal hernias and small bowel diverticula. There is no evidence of stranding around the diverticuli no evidence of bowel involvement in the  hernias. I have personally reviewed the patient's imaging, laboratory findings and medical records.    Assessment    81 year old female with small bowel diverticuli asymptomatic bilateral internal hernias.    Plan    Discussed with the patient and her husband that given her age and mental status and lack of symptoms from the CT findings that there is no current indication for surgical intervention. They both voiced understanding and agreed that given her being asymptomatic that we would take a wait and see approach. Should she develop any symptoms from her hernias or her diverticula she will return to clinic immediately or the emergency department at clinic is not available. Follow-up when necessary     Time spent with the patient was 60 minutes, with more than 50% of the time spent in face-to-face education, counseling and care coordination.     Clayburn Pert, MD FACS General Surgeon 05/17/2015, 1:56 PM

## 2015-05-21 ENCOUNTER — Ambulatory Visit (INDEPENDENT_AMBULATORY_CARE_PROVIDER_SITE_OTHER): Payer: Medicare Other | Admitting: Family Medicine

## 2015-05-21 ENCOUNTER — Encounter: Payer: Self-pay | Admitting: Family Medicine

## 2015-05-21 VITALS — BP 157/77 | HR 50 | Temp 97.6°F | Wt 122.0 lb

## 2015-05-21 DIAGNOSIS — F0390 Unspecified dementia without behavioral disturbance: Secondary | ICD-10-CM

## 2015-05-21 DIAGNOSIS — R1310 Dysphagia, unspecified: Secondary | ICD-10-CM | POA: Diagnosis not present

## 2015-05-21 DIAGNOSIS — K458 Other specified abdominal hernia without obstruction or gangrene: Secondary | ICD-10-CM

## 2015-05-21 DIAGNOSIS — I10 Essential (primary) hypertension: Secondary | ICD-10-CM | POA: Diagnosis not present

## 2015-05-21 DIAGNOSIS — R531 Weakness: Secondary | ICD-10-CM | POA: Diagnosis not present

## 2015-05-21 DIAGNOSIS — R05 Cough: Secondary | ICD-10-CM | POA: Diagnosis not present

## 2015-05-21 DIAGNOSIS — R059 Cough, unspecified: Secondary | ICD-10-CM

## 2015-05-21 NOTE — Progress Notes (Signed)
BP 157/77 mmHg  Pulse 50  Temp(Src) 97.6 F (36.4 C)  Wt 122 lb (55.339 kg)  SpO2 96%   Subjective:    Patient ID: Felicia Terry, female    DOB: Mar 17, 1930, 81 y.o.   MRN: [id number]  HPI: Felicia Terry is a 81 y.o. female  Chief Complaint  Patient presents with  . Follow-up    She hasn't had any appetite the last few weeks, she forgot to mention it to you last time.  . Cough    she still has the cough that won't go away   She saw Dr. Nehemiah Massed and said her heart rate was low; he changed her heart medicine from 90 mg to 30 mg and he (husband) thinks she is taking it correctly She says her eyes are not open as they used to be; I asked if her lids felt tired and feel worse later in the day; she is going to see her eye doctor; they looked red with the pneumonia She has not had chest CT yet; they go for the chest CT next Monday (CMA gave husband  She has not had any appetite; she forgot to say anything last time, but having to force herself to eat She can get about ten bites okay and then chewing and then can't swallow it very well; she gets 6-8 bites and then can't swallow it I spoke to her husband privately; he is not sure if taking extra medicine; he thinks her memory is about stable, not really much worse than before She does not really choke on her food, but husband says she'll get halfway through her sentence and then forget to keep going She does the same with eating, gets halfway through her eating and then train of thought gone and she doesn't finish They have not been to see the neurologist and they can't remember who it is I asked what her main problem seemed to be her eyes and her cough; she says it looks like snot; it's just clear; no blood; sometimes get postnasal drip She saw Dr. Adonis Huguenin on Dec 8th for the hernia; he was good her husband says; he pushed one side back in place and she was not expecting that; he said to just wait and watch unless as no pain She had  pelvic reconstruction in 2006 She says the eating problem is just when she had her thyroid problem; it was maybe five years; she did not have a tumor on her thyroid  She lost her weight in October when she had pneumonia  Relevant past medical, surgical, family and social history reviewed and updated as indicated. Interim medical history since our last visit reviewed. Allergies and medications reviewed and updated.  Review of Systems Per HPI unless specifically indicated above     Objective:    BP 157/77 mmHg  Pulse 50  Temp(Src) 97.6 F (36.4 C)  Wt 122 lb (55.339 kg)  SpO2 96%  Wt Readings from Last 3 Encounters:  05/21/15 122 lb (55.339 kg)  05/17/15 122 lb (55.339 kg)  04/30/15 122 lb (55.339 kg)    Physical Exam  Constitutional: She appears well-developed and well-nourished. No distress.  Eyes: EOM are normal. Pupils are equal, round, and reactive to light. Right eye exhibits no discharge. Left eye exhibits no discharge. Right conjunctiva is not injected. Left conjunctiva is not injected. No scleral icterus. Right pupil is round. Left pupil is round.  No appreciable lid lag  Neck: No thyromegaly present.  Cardiovascular:  Bradycardia present.   Pulmonary/Chest: Effort normal.  Abdominal: She exhibits no distension.  Lymphadenopathy:    She has no cervical adenopathy.  Neurological: She displays no tremor. Gait normal.  Skin: Skin is warm and dry. She is not diaphoretic. No pallor.  Psychiatric: She has a normal mood and affect. Judgment normal. Her speech is not delayed and not slurred. She is not agitated, not aggressive, not slowed and not withdrawn. Cognition and memory are impaired. She exhibits abnormal recent memory.  Very pleasant, though confused at times, looks to her husband for help answering questions      Assessment & Plan:   Problem List Items Addressed This Visit      Cardiovascular and Mediastinum   Benign essential HTN    She is working with her  cardiologist; medicine (CCB) dose just reduced, though still bradycardic here today on exam        Digestive   Dysphagia    Will get swallow study; not sure if neuromuscular condition could be contibuting; will refer her back to neurologist; chest CT is also pending, as there could be compressive effect if mass in the thoracic cavity      Relevant Orders   Ambulatory referral to Neurology   DG Esophagus     Nervous and Auditory   Dementia    Refer back to Dr. Melrose Nakayama, neurologist      Relevant Orders   Ambulatory referral to Neurology     Musculoskeletal and Integument   Hernia of pelvic floor    Appreciate evalaution by surgeon        Other   Cough    Will get spirometry with graph; also the Chest CT is pending      Relevant Orders   Spirometry with graph (Completed)   DG Esophagus   Weakness - Primary    Patient has weakness, reports eyelid heaviness, dysphagia; will check acetylcholinesterase antibody and refer back to neurologist      Relevant Orders   Acetylcholine receptor, blocking Abs (Completed)   Comprehensive metabolic panel (Completed)   CBC with Differential/Platelet (Completed)   Ambulatory referral to Neurology   DG Esophagus      Follow up plan: No Follow-up on file. She has had several visits with me recently; we decided to talk by phone on Tuesday afternoon as we continue to try to figure her symptoms out; chest CT results will be discussed by phone scheduled for next week; she'll have other tests and see other speicalists; I am here for them if anything changes, but we'll leave follow-up open ended right now pending other studies; flag to self to call her Tuesday afternoon, as well as future flag to self to check on her status in two weeks  Orders Placed This Encounter  Procedures  . DG Esophagus  . Acetylcholine receptor, blocking Abs  . Comprehensive metabolic panel  . CBC with Differential/Platelet  . Ambulatory referral to Neurology  .  Spirometry with graph   An after-visit summary was printed and given to the patient at [facility].  Please see the patient instructions which may contain other information and recommendations beyond what is mentioned above in the assessment and plan. Face-to-face time with patient was more than 25 minutes, >50% time spent counseling and coordination of care

## 2015-05-21 NOTE — Assessment & Plan Note (Addendum)
Will get spirometry with graph; also the Chest CT is pending

## 2015-05-21 NOTE — Patient Instructions (Addendum)
We'll get a swallowing study We'll get the chest CT next week We'll have you see Dr. Melrose Nakayama to see if he thinks these issues are related I'll look back to see what was going on with your thyroid about five years ago -- request records from Dr. Hall Busing all about the thyroid deal from about five years ago If anything changes, please let me know If you change your mind about seeing the lung specialist, just call and let me know We'll have a phone consult next Tuesday afternoon

## 2015-05-21 NOTE — Assessment & Plan Note (Addendum)
Refer back to Dr. Melrose Nakayama, neurologist

## 2015-05-22 LAB — COMPREHENSIVE METABOLIC PANEL
A/G RATIO: 1.7 (ref 1.1–2.5)
ALBUMIN: 3.9 g/dL (ref 3.5–4.7)
ALK PHOS: 52 IU/L (ref 39–117)
ALT: 21 IU/L (ref 0–32)
AST: 24 IU/L (ref 0–40)
BILIRUBIN TOTAL: 0.5 mg/dL (ref 0.0–1.2)
BUN / CREAT RATIO: 14 (ref 11–26)
BUN: 13 mg/dL (ref 8–27)
CHLORIDE: 99 mmol/L (ref 96–106)
CO2: 25 mmol/L (ref 18–29)
CREATININE: 0.95 mg/dL (ref 0.57–1.00)
Calcium: 8.9 mg/dL (ref 8.7–10.3)
GFR calc Af Amer: 64 mL/min/{1.73_m2} (ref >59)
GFR calc non Af Amer: 56 mL/min/{1.73_m2} — ABNORMAL LOW (ref >59)
GLOBULIN, TOTAL: 2.3 g/dL (ref 1.5–4.5)
Glucose: 94 mg/dL (ref 65–99)
POTASSIUM: 4.3 mmol/L (ref 3.5–5.2)
SODIUM: 139 mmol/L (ref 134–144)
Total Protein: 6.2 g/dL (ref 6.0–8.5)

## 2015-05-22 LAB — CBC WITH DIFFERENTIAL/PLATELET
Basophils Absolute: 0 10*3/uL (ref 0.0–0.2)
Basos: 1 %
EOS (ABSOLUTE): 0.2 10*3/uL (ref 0.0–0.4)
EOS: 3 %
HEMATOCRIT: 39.5 % (ref 34.0–46.6)
Hemoglobin: 13.4 g/dL (ref 11.1–15.9)
Immature Grans (Abs): 0 10*3/uL (ref 0.0–0.1)
Immature Granulocytes: 0 %
LYMPHS ABS: 1.2 10*3/uL (ref 0.7–3.1)
Lymphs: 21 %
MCH: 33.3 pg — ABNORMAL HIGH (ref 26.6–33.0)
MCHC: 33.9 g/dL (ref 31.5–35.7)
MCV: 98 fL — AB (ref 79–97)
MONOS ABS: 0.7 10*3/uL (ref 0.1–0.9)
Monocytes: 12 %
NEUTROS PCT: 63 %
Neutrophils Absolute: 3.6 10*3/uL (ref 1.4–7.0)
Platelets: 186 10*3/uL (ref 150–379)
RBC: 4.03 x10E6/uL (ref 3.77–5.28)
RDW: 14.5 % (ref 12.3–15.4)
WBC: 5.6 10*3/uL (ref 3.4–10.8)

## 2015-05-24 LAB — ACETYLCHOLINE RECEPTOR, BLOCKING: ACETYLCHOL BLOCK AB: 15 % (ref 0–25)

## 2015-05-26 NOTE — Assessment & Plan Note (Signed)
Appreciate evalaution by surgeon

## 2015-05-26 NOTE — Assessment & Plan Note (Addendum)
Will get swallow study; not sure if neuromuscular condition could be contibuting; will refer her back to neurologist; chest CT is also pending, as there could be compressive effect if mass in the thoracic cavity

## 2015-05-26 NOTE — Assessment & Plan Note (Signed)
Patient has weakness, reports eyelid heaviness, dysphagia; will check acetylcholinesterase antibody and refer back to neurologist

## 2015-05-26 NOTE — Assessment & Plan Note (Signed)
She is working with her cardiologist; medicine (CCB) dose just reduced, though still bradycardic here today on exam

## 2015-05-28 ENCOUNTER — Ambulatory Visit: Admission: RE | Admit: 2015-05-28 | Payer: Medicare Other | Source: Ambulatory Visit

## 2015-06-05 ENCOUNTER — Other Ambulatory Visit: Payer: Self-pay

## 2015-06-05 MED ORDER — PRAVASTATIN SODIUM 40 MG PO TABS: 40.0000 mg | ORAL_TABLET | Freq: Every day | ORAL | Status: DC

## 2015-06-05 NOTE — Telephone Encounter (Signed)
Normal ALT Dec 2016, lipids from October 2016 reviewed Rx approved ------------------- I spoke with patient's husband She did not keep the appt for the chest CT last week I spoke with patient; she says she probably just forgot it She is better a little bit, she says; nothing bothering her right now --------------------- [facility] -- please reschedule the chest CT and get the esophagus test schedule Please communicate with husband about the dates and times of appointments (patient has dementia)

## 2015-06-05 NOTE — Telephone Encounter (Signed)
Routing to provider  

## 2015-06-15 ENCOUNTER — Other Ambulatory Visit: Payer: Self-pay

## 2015-06-15 MED ORDER — LISINOPRIL 2.5 MG PO TABS: 2.5000 mg | ORAL_TABLET | Freq: Every day | ORAL | Status: DC

## 2015-06-15 NOTE — Telephone Encounter (Signed)
Routing to provider  

## 2015-06-15 NOTE — Telephone Encounter (Signed)
Dec 2016 Cr, K+, and GFR reviewed; Rx approved

## 2015-06-26 DIAGNOSIS — I340 Nonrheumatic mitral (valve) insufficiency: Secondary | ICD-10-CM | POA: Diagnosis not present

## 2015-06-26 DIAGNOSIS — I480 Paroxysmal atrial fibrillation: Secondary | ICD-10-CM | POA: Diagnosis not present

## 2015-06-26 DIAGNOSIS — I10 Essential (primary) hypertension: Secondary | ICD-10-CM | POA: Diagnosis not present

## 2015-06-26 DIAGNOSIS — E782 Mixed hyperlipidemia: Secondary | ICD-10-CM | POA: Diagnosis not present

## 2015-06-26 DIAGNOSIS — R0602 Shortness of breath: Secondary | ICD-10-CM | POA: Diagnosis not present

## 2015-06-26 DIAGNOSIS — R001 Bradycardia, unspecified: Secondary | ICD-10-CM | POA: Diagnosis not present

## 2015-06-26 DIAGNOSIS — R000 Tachycardia, unspecified: Secondary | ICD-10-CM | POA: Diagnosis not present

## 2015-06-26 DIAGNOSIS — I351 Nonrheumatic aortic (valve) insufficiency: Secondary | ICD-10-CM | POA: Diagnosis not present

## 2015-06-26 DIAGNOSIS — I071 Rheumatic tricuspid insufficiency: Secondary | ICD-10-CM | POA: Diagnosis not present

## 2015-06-26 DIAGNOSIS — I2510 Atherosclerotic heart disease of native coronary artery without angina pectoris: Secondary | ICD-10-CM | POA: Diagnosis not present

## 2015-06-29 ENCOUNTER — Other Ambulatory Visit: Payer: Self-pay | Admitting: Family Medicine

## 2015-06-29 DIAGNOSIS — Z23 Encounter for immunization: Secondary | ICD-10-CM | POA: Diagnosis not present

## 2015-06-29 NOTE — Telephone Encounter (Signed)
October TSH normal; Rx approved

## 2015-07-07 ENCOUNTER — Emergency Department
Admission: EM | Admit: 2015-07-07 | Discharge: 2015-07-07 | Disposition: A | Discharge status: Home / Self Care | Payer: Medicare Other | Attending: Emergency Medicine | Admitting: Emergency Medicine

## 2015-07-07 ENCOUNTER — Emergency Department: Payer: Medicare Other

## 2015-07-07 DIAGNOSIS — Z79899 Other long term (current) drug therapy: Secondary | ICD-10-CM | POA: Diagnosis not present

## 2015-07-07 DIAGNOSIS — I10 Essential (primary) hypertension: Secondary | ICD-10-CM | POA: Insufficient documentation

## 2015-07-07 DIAGNOSIS — R002 Palpitations: Secondary | ICD-10-CM | POA: Diagnosis not present

## 2015-07-07 DIAGNOSIS — Z7982 Long term (current) use of aspirin: Secondary | ICD-10-CM | POA: Insufficient documentation

## 2015-07-07 LAB — CBC
HCT: 43.2 % (ref 35.0–47.0)
Hemoglobin: 14.8 g/dL (ref 12.0–16.0)
MCH: 33.4 pg (ref 26.0–34.0)
MCHC: 34.4 g/dL (ref 32.0–36.0)
MCV: 97 fL (ref 80.0–100.0)
Platelets: 180 10*3/uL (ref 150–440)
RBC: 4.45 MIL/uL (ref 3.80–5.20)
RDW: 13.4 % (ref 11.5–14.5)
WBC: 5.5 10*3/uL (ref 3.6–11.0)

## 2015-07-07 LAB — BASIC METABOLIC PANEL
Anion gap: 6 (ref 5–15)
BUN: 17 mg/dL (ref 6–20)
CALCIUM: 9.1 mg/dL (ref 8.9–10.3)
CO2: 28 mmol/L (ref 22–32)
Chloride: 106 mmol/L (ref 101–111)
Creatinine, Ser: 0.86 mg/dL (ref 0.44–1.00)
GFR calc Af Amer: >60 mL/min (ref >60)
GLUCOSE: 95 mg/dL (ref 65–99)
POTASSIUM: 3.8 mmol/L (ref 3.5–5.1)
Sodium: 140 mmol/L (ref 135–145)

## 2015-07-07 LAB — TROPONIN I: Troponin I: <0.03 ng/mL (ref <0.031)

## 2015-07-07 NOTE — ED Notes (Signed)
Pt reports she woke up around 3 am with her heart beating fast. States she went back to sleep and woke up with it beating even faster. Pt denies nausea or shortness of breath. Pt denies chest pain.

## 2015-07-07 NOTE — Discharge Instructions (Signed)

## 2015-07-07 NOTE — ED Provider Notes (Signed)
[facility] Emergency Department Provider Note  ____________________________________________  Time seen: 5:15 AM  I have reviewed the triage vital signs and the nursing notes.   HISTORY  Chief Complaint Palpitations      HPI Felicia Terry is a 82 y.o. female presents with chief complaint of "heart racing on awakening at 3 AM this morning. Patient states that for "a long time every morning between the hours of 3 and 5:00 she is awakened by her heart racing. Patient denies any chest pain no shortness of breath no dizziness nausea or vomiting. Patient's heart rate on presentation to the emergency department 62. Current cardiac monitor reveals normal sinus rhythm     Past Medical History  Diagnosis Date  . Hypertension   . 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 Coronary artery disease     cardiac stent    Patient Active Problem List   Diagnosis Date Noted  . Weakness 05/21/2015  . Dysphagia 05/21/2015  . Arteriosclerosis of coronary artery 05/15/2015  . HLD (hyperlipidemia) 05/15/2015  . Benign essential HTN 05/15/2015  . Combined fat and carbohydrate induced hyperlipemia 05/15/2015  . Hernia of pelvic floor 04/30/2015  . Breast cancer screening 04/30/2015  . Macrocytosis 04/12/2015  . Senile purpura ([place]) 04/12/2015  . Fatigue 04/09/2015  . Abnormal weight loss 04/09/2015  . Cough 04/09/2015  . Hematuria, microscopic 04/09/2015  . Dementia 04/09/2015  . Dyslipidemia 04/09/2015  . Medication monitoring encounter 04/09/2015  . Other specified hypothyroidism 04/09/2015  . Essential hypertension, benign 04/09/2015  . Breathlessness on exertion 10/31/2014  . TI (tricuspid incompetence) 10/31/2014  . MI (mitral incompetence) 10/31/2014  . AI (aortic incompetence) 10/31/2014  . Bradycardia 10/31/2014  . Beat, premature ventricular 03/22/2014  . Paroxysmal atrial fibrillation ([place]) 03/02/2014    Past Surgical History  Procedure Laterality Date  .  Abdominal hysterectomy      Current Outpatient Rx  Name  Route  Sig  Dispense  Refill  . aspirin EC 81 MG tablet   Oral   Take 81 mg by mouth daily.         Marland Kitchen diltiazem (CARDIZEM) 30 MG tablet   Oral   Take 1 tablet by mouth 2 (two) times daily.         Marland Kitchen esomeprazole (NEXIUM) 40 MG capsule   Oral   Take 40 mg by mouth daily.         Marland Kitchen levothyroxine (SYNTHROID, LEVOTHROID) 25 MCG tablet   Oral   Take 1 tablet (25 mcg total) by mouth daily.   90 tablet   1   . lisinopril (PRINIVIL,ZESTRIL) 2.5 MG tablet   Oral   Take 1 tablet (2.5 mg total) by mouth daily.   30 tablet   4   . Multiple Vitamin (MULTI-VITAMINS) TABS   Oral   Take by mouth daily.         . polyethylene glycol (MIRALAX / GLYCOLAX) packet   Oral   Take 17 g by mouth every other day.         . pravastatin (PRAVACHOL) 40 MG tablet   Oral   Take 1 tablet (40 mg total) by mouth at bedtime.   30 tablet   2   . propafenone (RYTHMOL SR) 225 MG 12 hr capsule   Oral   Take 225 mg by mouth 2 (two) times daily.          . vitamin B-12 (CYANOCOBALAMIN) 1000 MCG tablet   Oral   Take 1  tablet (1,000 mcg total) by mouth once a week.   4 tablet   11     Allergies Review of patient's allergies indicates no known allergies.  Family History  Problem Relation Age of Onset  . Diabetes Mother   . Cancer Neg Hx   . Heart disease Neg Hx   . Stroke Neg Hx     Social History Social History  Substance Use Topics  . Smoking status: Never Smoker   . Smokeless tobacco: Never Used  . Alcohol Use: No    Review of Systems  Constitutional: Negative for fever. Eyes: Negative for visual changes. ENT: Negative for sore throat. Cardiovascular: Negative for chest pain. Positive for palpitations Respiratory: Negative for shortness of breath. Gastrointestinal: Negative for abdominal pain, vomiting and diarrhea. Genitourinary: Negative for dysuria. Musculoskeletal: Negative for back pain. Skin:  Negative for rash. Neurological: Negative for headaches, focal weakness or numbness.   10-point ROS otherwise negative.  ____________________________________________   PHYSICAL EXAM:  VITAL SIGNS: ED Triage Vitals  Enc Vitals Group     BP 07/07/15 0507 177/68 mmHg     Pulse Rate 07/07/15 0507 62     Resp 07/07/15 0507 18     Temp 07/07/15 0507 97.4 F (36.3 C)     Temp Source 07/07/15 0507 Oral     SpO2 07/07/15 0507 97 %     Weight 07/07/15 0507 120 lb (54.432 kg)     Height 07/07/15 0507 5' 1.5" (1.562 m)     Head Cir --      Peak Flow --      Pain Score 07/07/15 0502 0     Pain Loc --      Pain Edu? --      Excl. in [place]? --     Constitutional: Alert and oriented. Well appearing and in no distress. Eyes: Conjunctivae are normal. PERRL. Normal extraocular movements. ENT   Head: Normocephalic and atraumatic.   Nose: No congestion/rhinnorhea.   Mouth/Throat: Mucous membranes are moist.   Neck: No stridor. Hematological/Lymphatic/Immunilogical: No cervical lymphadenopathy. Cardiovascular: Normal rate, regular rhythm. Normal and symmetric distal pulses are present in all extremities. No murmurs, rubs, or gallops. Respiratory: Normal respiratory effort without tachypnea nor retractions. Breath sounds are clear and equal bilaterally. No wheezes/rales/rhonchi. Gastrointestinal: Soft and nontender. No distention. There is no CVA tenderness. Genitourinary: deferred Musculoskeletal: Nontender with normal range of motion in all extremities. No joint effusions.  No lower extremity tenderness nor edema. Neurologic:  Normal speech and language. No gross focal neurologic deficits are appreciated. Speech is normal.  Skin:  Skin is warm, dry and intact. No rash noted. Psychiatric: Mood and affect are normal. Speech and behavior are normal. Patient exhibits appropriate insight and judgment.  ____________________________________________    LABS (pertinent  positives/negatives)  Labs Reviewed  BASIC METABOLIC PANEL  CBC  TROPONIN I     ____________________________________________   EKG  ED ECG REPORT I, Gibson Lad, [place] N, the attending physician, personally viewed and interpreted this ECG.   Date: 07/07/2015  EKG Time: 5:05 AM  Rate: 63  Rhythm: Normal sinus rhythm  Axis: None  Intervals: Normal  ST&T Change: None   ____________________________________________    RADIOLOGY   DG Chest Port 1 View (Final result) Result time: 07/07/15 05:42:49   Final result by Rad Results In Interface (07/07/15 05:42:49)   Narrative:   CLINICAL DATA: Initial evaluation for acute palpitations.  EXAM: PORTABLE CHEST 1 VIEW  COMPARISON: Prior study from 04/10/2015.  FINDINGS: Cardiac  and mediastinal silhouettes are stable in size and contour, and remain within normal limits. Atheromatous plaque within the aortic arch.  Mild elevation of the left hemidiaphragm similar to prior. Minimal left basilar opacity most consistent with atelectasis/ scar. No other focal airspace disease. No pulmonary edema or pleural effusion. No pneumothorax.  No acute osseus abnormality. Degenerative changes about the shoulders.  IMPRESSION: 1. Similar elevation of the mid left hemidiaphragm with associate left basilar opacity, most consistent with atelectasis/scar. 2. No other active cardiopulmonary disease.   Electronically Signed By: Jeannine Boga M.D. On: 07/07/2015 05:42           INITIAL IMPRESSION / ASSESSMENT AND PLAN / ED COURSE  Pertinent labs & imaging results that were available during my care of the patient were reviewed by me and considered in my medical decision making (see chart for details).  No clear etiology for the patient's perceived palpitations at 3 AM. Patient remained in normal sinus rhythm with normal rate during emergency department stay. We'll refer patient to her cardiologist Dr. Zelphia Cairo  possible outpatient Holter monitor placement.  ____________________________________________   FINAL CLINICAL IMPRESSION(S) / ED DIAGNOSES  Final diagnoses:  Heart palpitations      Gregor Hams, MD 07/07/15 [phone]

## 2015-07-11 DIAGNOSIS — I2510 Atherosclerotic heart disease of native coronary artery without angina pectoris: Secondary | ICD-10-CM | POA: Diagnosis not present

## 2015-07-11 DIAGNOSIS — I480 Paroxysmal atrial fibrillation: Secondary | ICD-10-CM | POA: Diagnosis not present

## 2015-07-11 DIAGNOSIS — R000 Tachycardia, unspecified: Secondary | ICD-10-CM | POA: Diagnosis not present

## 2015-07-11 DIAGNOSIS — I10 Essential (primary) hypertension: Secondary | ICD-10-CM | POA: Diagnosis not present

## 2015-07-11 DIAGNOSIS — I351 Nonrheumatic aortic (valve) insufficiency: Secondary | ICD-10-CM | POA: Diagnosis not present

## 2015-07-11 DIAGNOSIS — I340 Nonrheumatic mitral (valve) insufficiency: Secondary | ICD-10-CM | POA: Diagnosis not present

## 2015-07-11 DIAGNOSIS — R0602 Shortness of breath: Secondary | ICD-10-CM | POA: Diagnosis not present

## 2015-07-11 DIAGNOSIS — E782 Mixed hyperlipidemia: Secondary | ICD-10-CM | POA: Diagnosis not present

## 2015-07-11 DIAGNOSIS — I071 Rheumatic tricuspid insufficiency: Secondary | ICD-10-CM | POA: Diagnosis not present

## 2015-07-11 DIAGNOSIS — R001 Bradycardia, unspecified: Secondary | ICD-10-CM | POA: Diagnosis not present

## 2015-07-17 NOTE — Telephone Encounter (Signed)
Keri, do you mind checking out the message below that was sent for [place]? It doesn't look like the tests were ever scheduled. Thanks.

## 2015-07-17 NOTE — Telephone Encounter (Signed)
She was scheduled for her CT scan 05/28/2015 but no showed. Not positive if Tiffany or Seth Ward told her of her appointment and she forgot, because of her dementia. Her appointments are set up for 07/26/2015. She will arrive at 9:00am for her DG esophagus (barium swallow). She will have to drink the barium and the xray will be taken at 9:30. At 10:00 she will get her CT scan of chest completed.   She cannot eat or drink anything after 6:00am. She will go to the hospital [facility]) medical mall entrance. Tried calling patient's husband and he wasn't home at the time.

## 2015-07-18 DIAGNOSIS — I480 Paroxysmal atrial fibrillation: Secondary | ICD-10-CM | POA: Diagnosis not present

## 2015-07-18 DIAGNOSIS — R002 Palpitations: Secondary | ICD-10-CM | POA: Diagnosis not present

## 2015-07-18 DIAGNOSIS — E782 Mixed hyperlipidemia: Secondary | ICD-10-CM | POA: Diagnosis not present

## 2015-07-23 DIAGNOSIS — R001 Bradycardia, unspecified: Secondary | ICD-10-CM | POA: Diagnosis not present

## 2015-07-23 DIAGNOSIS — R0602 Shortness of breath: Secondary | ICD-10-CM | POA: Diagnosis not present

## 2015-07-26 ENCOUNTER — Ambulatory Visit: Admission: RE | Admit: 2015-07-26 | Payer: Medicare Other | Source: Ambulatory Visit

## 2015-07-26 ENCOUNTER — Ambulatory Visit: Payer: Medicare Other | Attending: Family Medicine

## 2015-07-26 NOTE — Telephone Encounter (Signed)
I still need the patient to have the tests done per my note December 27th; thank you

## 2015-07-26 NOTE — Telephone Encounter (Signed)
Keri: This patient did not go for this today. Unsure of what to do about this.

## 2015-07-26 NOTE — Telephone Encounter (Signed)
Should we try calling and rescheduling this? This is the third one she's missed. Most likely because of her dementia, but I contact information with husband.

## 2015-07-27 NOTE — Telephone Encounter (Signed)
This was sent back to me again; please see note below; thank you

## 2015-07-31 NOTE — Telephone Encounter (Signed)
I talked with patient and then her husband He says that my staff called about missed appointments I still need her to have esophagus and chest looked at; she is still having coughing and sputtering; no further weight loss

## 2015-07-31 NOTE — Telephone Encounter (Signed)
I talked to patient's husband and explained patient has missed a few imaging appointments. I asked if everything was ok and he replied yes-he didn't know of any appointments. Tiffany scheduled one appointment and must have notified the [facility] herself and not the husband (pt has dementia).  When I made the appointment I tried calling husband and he wasn't there so I asked if would call me back. I documented in detail what needed to happen for the patient if he did call back-but I'm guessing he never did because patient still no showed for her radiology appointment this month.  I asked today if I made the appointment this afternoon could I call and he be around for me to notify him. Patient said he needed to talk to his wife first and would call us back.   Not sure if you Dr. Sanda Klein would want to try reaching out since this is now a little confusing. Hopefully patient's husband calls [place] back and we can schedule the appointment. The biggest thing is to make sure husband knows of this because of patient's dementia.

## 2015-07-31 NOTE — Telephone Encounter (Signed)
Patient's husband given Felicia Terry number to call radiology and speak with radiology to schedule an appointment for his wife. Told him to tell radiology that the order was already in there, she's missed a couple of appointments for it.   Explained to call back with any further questions.

## 2015-08-16 ENCOUNTER — Ambulatory Visit
Admission: RE | Admit: 2015-08-16 | Discharge: 2015-08-16 | Disposition: A | Discharge status: Home / Self Care | Payer: Medicare Other | Source: Ambulatory Visit | Attending: Family Medicine | Admitting: Family Medicine

## 2015-08-16 ENCOUNTER — Ambulatory Visit: Admission: RE | Admit: 2015-08-16 | Payer: Medicare Other | Source: Ambulatory Visit

## 2015-08-16 DIAGNOSIS — M2578 Osteophyte, vertebrae: Secondary | ICD-10-CM | POA: Diagnosis not present

## 2015-08-16 DIAGNOSIS — M4316 Spondylolisthesis, lumbar region: Secondary | ICD-10-CM | POA: Insufficient documentation

## 2015-08-16 DIAGNOSIS — R059 Cough, unspecified: Secondary | ICD-10-CM

## 2015-08-16 DIAGNOSIS — R1310 Dysphagia, unspecified: Secondary | ICD-10-CM

## 2015-08-16 DIAGNOSIS — R531 Weakness: Secondary | ICD-10-CM

## 2015-08-16 DIAGNOSIS — K449 Diaphragmatic hernia without obstruction or gangrene: Secondary | ICD-10-CM | POA: Diagnosis not present

## 2015-08-16 DIAGNOSIS — R05 Cough: Secondary | ICD-10-CM

## 2015-08-20 ENCOUNTER — Ambulatory Visit
Admission: RE | Admit: 2015-08-20 | Discharge: 2015-08-20 | Disposition: A | Discharge status: Home / Self Care | Payer: Medicare Other | Source: Ambulatory Visit | Attending: Family Medicine | Admitting: Family Medicine

## 2015-08-20 ENCOUNTER — Other Ambulatory Visit: Payer: Self-pay | Admitting: Family Medicine

## 2015-08-20 DIAGNOSIS — R634 Abnormal weight loss: Secondary | ICD-10-CM | POA: Insufficient documentation

## 2015-08-20 DIAGNOSIS — I2510 Atherosclerotic heart disease of native coronary artery without angina pectoris: Secondary | ICD-10-CM | POA: Diagnosis not present

## 2015-08-20 DIAGNOSIS — R911 Solitary pulmonary nodule: Secondary | ICD-10-CM

## 2015-08-20 DIAGNOSIS — I77810 Thoracic aortic ectasia: Secondary | ICD-10-CM

## 2015-08-20 DIAGNOSIS — R059 Cough, unspecified: Secondary | ICD-10-CM

## 2015-08-20 DIAGNOSIS — R05 Cough: Secondary | ICD-10-CM | POA: Diagnosis not present

## 2015-08-20 LAB — POCT I-STAT CREATININE: Creatinine, Ser: 0.8 mg/dL (ref 0.44–1.00)

## 2015-08-20 MED ORDER — IOHEXOL 300 MG/ML  SOLN
75.0000 mL | Freq: Once | INTRAMUSCULAR | Status: AC | PRN
Start: 2015-08-20 — End: 2015-08-20
  Administered 2015-08-20: 75 mL via INTRAVENOUS

## 2015-08-20 MED ORDER — IOHEXOL 300 MG/ML  SOLN
75.0000 mL | Freq: Once | INTRAMUSCULAR | Status: AC | PRN
  Administered 2015-08-20: 75 mL via INTRAVENOUS

## 2015-08-21 ENCOUNTER — Telehealth: Payer: Self-pay | Admitting: Family Medicine

## 2015-08-21 DIAGNOSIS — R911 Solitary pulmonary nodule: Secondary | ICD-10-CM | POA: Insufficient documentation

## 2015-08-21 DIAGNOSIS — I77810 Thoracic aortic ectasia: Secondary | ICD-10-CM | POA: Insufficient documentation

## 2015-08-21 NOTE — Telephone Encounter (Signed)
Please let TYKEIA RODENBECK know that I'd like to see patient for an appointment here in the office for:  Follow-up on imaging tests Please schedule a visit with me  in the next: two weeks Fasting?  NO Thank you, Dr. Sanda Klein

## 2015-08-24 NOTE — Telephone Encounter (Signed)
Felicia Terry, please see note below and schedule an appt for this patient; thank you

## 2015-08-27 NOTE — Telephone Encounter (Signed)
appt 08/31/15

## 2015-08-31 ENCOUNTER — Ambulatory Visit (INDEPENDENT_AMBULATORY_CARE_PROVIDER_SITE_OTHER): Payer: Medicare Other | Admitting: Family Medicine

## 2015-08-31 ENCOUNTER — Encounter: Payer: Self-pay | Admitting: Family Medicine

## 2015-08-31 VITALS — BP 149/78 | HR 63 | Temp 98.3°F | Ht 60.0 in | Wt 119.0 lb

## 2015-08-31 DIAGNOSIS — M4310 Spondylolisthesis, site unspecified: Secondary | ICD-10-CM

## 2015-08-31 DIAGNOSIS — Q762 Congenital spondylolisthesis: Secondary | ICD-10-CM

## 2015-08-31 DIAGNOSIS — I10 Essential (primary) hypertension: Secondary | ICD-10-CM

## 2015-08-31 DIAGNOSIS — K458 Other specified abdominal hernia without obstruction or gangrene: Secondary | ICD-10-CM

## 2015-08-31 DIAGNOSIS — E785 Hyperlipidemia, unspecified: Secondary | ICD-10-CM

## 2015-08-31 DIAGNOSIS — E038 Other specified hypothyroidism: Secondary | ICD-10-CM | POA: Diagnosis not present

## 2015-08-31 DIAGNOSIS — F0390 Unspecified dementia without behavioral disturbance: Secondary | ICD-10-CM

## 2015-08-31 DIAGNOSIS — I2510 Atherosclerotic heart disease of native coronary artery without angina pectoris: Secondary | ICD-10-CM

## 2015-08-31 DIAGNOSIS — I77810 Thoracic aortic ectasia: Secondary | ICD-10-CM | POA: Diagnosis not present

## 2015-08-31 DIAGNOSIS — D485 Neoplasm of uncertain behavior of skin: Secondary | ICD-10-CM | POA: Diagnosis not present

## 2015-08-31 DIAGNOSIS — R911 Solitary pulmonary nodule: Secondary | ICD-10-CM | POA: Diagnosis not present

## 2015-08-31 MED ORDER — TRIAMCINOLONE ACETONIDE 0.1 % EX CREA: 1.0000 "application " | TOPICAL_CREAM | Freq: Two times a day (BID) | CUTANEOUS | Status: DC

## 2015-08-31 NOTE — Progress Notes (Signed)
BP 149/78 mmHg  Pulse 63  Temp(Src) 98.3 F (36.8 C)  Ht 5' (1.524 m)  Wt 119 lb (53.978 kg)  BMI 23.24 kg/m2  SpO2 97%   Subjective:    Patient ID: Felicia Terry, female    DOB: 1929-08-05, 82 y.o.   MRN: [id number]  HPI: Felicia Terry is a 82 y.o. female  Chief Complaint  Patient presents with  . Results    discuss CT results   She is here for follow-up with her husband Patient has lost just one pound but has good appetite; feels full early; going on since last October; when she eats a half of a plate, she quits; appetite just goes, food not getting stuck; food just doesn't taste or smell the same before We reviewed her DG esophagus report from August 16, 2015: IMPRESSION: 1. No evidence of esophageal stricture nor esophagitis. 2. Small reducible hiatal hernia without significant gastroesophageal reflux. 3. The cervical esophagus exhibited no acute abnormality. 4. Grade 1 anterolisthesis of C3 with respect to C4 and C4 with respect to C5. Disc space narrowing and endplate osteophytes at C5-6 and C6-7. Further evaluation with a cervical spine series with flexion and extension lateral views would be useful.  Hernia of pelvic floor; saw surgeon and no plans for surgery; not causing her any problems  She was referred to neurologist, Dr. Melrose Nakayama but patient has not gone yet  Patient says she is taking her escitalopram but the bottle is from last May  She was referred to neurosurgeon but has not been; she says they wanted to fix it years ago and she didn't want it done; she denies weakness or numbness in arms or legs  Easily bruising, has scratches on the back of the neck; she feels a teeny weeny thing and tries to pick them  We also reviewed her CT scan chest with contrast from August 20, 2015: IMPRESSION: 1. Punctate (approximately 7 mm) lingular pulmonary nodule is unchanged since the 01/2014 examination. This examination documents 19 months of stability. Follow-up  examination after August of this year would ensure at least 2 years of stability and thus a benign etiology. 2. No new or enlarging pulmonary nodules. 3. Atherosclerosis including coronary artery calcifications. 4. Unchanged mild fusiform ectasia of the thin ascending thoracic aorta measuring 39 mm in diameter, unchanged since the 01/2014 examination. Recommend annual imaging followup by CTA or MRA. This recommendation follows 2010 ACCF/AHA/AATS/ACR/ASA/SCA/SCAI/SIR/STS/SVM Guidelines for the Diagnosis and Management of Patients with Thoracic Aortic Disease. Circulation. 2010; 121: [id number] She does know about blockages; has a stent in already; taking aspirin  Right knee arthritis, crunches and pops; doesn't lock up; has had injection in the knee before about two years ago; no falls recently  Relevant past medical, surgical, family and social history reviewed and updated as indicated. Interim medical history since our last visit reviewed. Allergies and medications reviewed and updated.  Review of Systems  Per HPI unless specifically indicated above     Objective:    BP 149/78 mmHg  Pulse 63  Temp(Src) 98.3 F (36.8 C)  Ht 5' (1.524 m)  Wt 119 lb (53.978 kg)  BMI 23.24 kg/m2  SpO2 97%  Wt Readings from Last 3 Encounters:  08/31/15 119 lb (53.978 kg)  07/07/15 120 lb (54.432 kg)  05/21/15 122 lb (55.339 kg)    Physical Exam  Constitutional: She appears well-developed and well-nourished. No distress.  Eyes: EOM are normal. Pupils are equal, round, and reactive to light. Right eye  exhibits no discharge. Left eye exhibits no discharge. Right conjunctiva is not injected. Left conjunctiva is not injected. No scleral icterus. Right pupil is round. Left pupil is round.  No appreciable lid lag  Neck: No thyromegaly present.  Cardiovascular: Normal rate and regular rhythm.   Pulmonary/Chest: Effort normal and breath sounds normal. She has no decreased breath sounds.  Abdominal:  She exhibits no distension.  Lymphadenopathy:    She has no cervical adenopathy.  Neurological: She is alert. She displays no tremor. Gait normal.  Skin: Skin is warm and dry. She is not diaphoretic. No pallor.  Keratotic lesion; several ecchymoses on extensor surfaces of arms; few excoriations arms, back of neck  Psychiatric: She has a normal mood and affect. Judgment normal. Her speech is not delayed and not slurred. She is not agitated, not aggressive, not slowed and not withdrawn. Cognition and memory are impaired. She exhibits abnormal recent memory.  Very pleasant, though confused at times, looks to her husband for help answering questions   Results for orders placed or performed during the hospital encounter of 08/20/15  I-STAT creatinine  Result Value Ref Range   Creatinine, Ser 0.80 0.44 - 1.00 mg/dL      Assessment & Plan:   Problem List Items Addressed This Visit      Cardiovascular and Mediastinum   Essential hypertension, benign    Controlled with a systolic less than Q000111Q mmHg today      Arteriosclerosis of coronary artery    Patient is aware of coronary atherosclerosis and had a stent place in 2011; f/u with cardiologist      Ectatic thoracic aorta [facility])    Noted on CT scan March 2017; reviewed with patient and husband at visit; explained yearly follow-up recommended with CTA or MRA once a year, next due March 2018        Endocrine   Other specified hypothyroidism    Last TSH in October was normal; next due in October 2017, sooner if more significant weight change or other s/s of over- or under-replacement        Nervous and Auditory   Dementia - Primary    With alteration in sense of smell and taste; patient with modest weight loss and no pathologic cause, suspecting dementia playing a role in these; I really encouraged her to please see her neurologist; this was clearly explained to patient and her husband who is present, and written on after visit summary; he  is in the care team, so his phone number is on her check out sheet/AVS      Relevant Medications   escitalopram (LEXAPRO) 5 MG tablet     Musculoskeletal and Integument   Hernia of pelvic floor    Patient has seen surgeon; no plans for operative management at this time      Anterolisthesis    C3 to C4, and C4 to C5; patient seen by neurosurgeon; patient declined surgery; asymptomatic at this time        Other   Dyslipidemia    On statin; reviewed last lipids; next fasting cholesterol panel and sgpt due on or after Oct 08, 2015      Pulmonary nodule, left    Reviewed findings from recent CT with patient and husband; 7 mm pulmonary nodule, lingula; noted 01/2014 scan; next (and last) CT scan August 2017 will complete two years of stability on imaging       Other Visit Diagnoses    Neoplasm of uncertain behavior of  skin of face        Relevant Orders    Ambulatory referral to Dermatology       Follow up plan: Return in about 6 weeks (around 10/12/2015) for follow-up; weight, memory, knee arthritis.  An after-visit summary was printed and given to the patient at [facility].  Please see the patient instructions which may contain other information and recommendations beyond what is mentioned above in the assessment and plan.  Meds ordered this encounter  Medications  . escitalopram (LEXAPRO) 5 MG tablet    Sig: Take 5 mg by mouth daily.  Marland Kitchen triamcinolone cream (KENALOG) 0.1 %    Sig: Apply 1 application topically 2 (two) times daily. Too strong for face    Dispense:  45 g    Refill:  0

## 2015-08-31 NOTE — Patient Instructions (Addendum)
Please do call Dr. Melrose Nakayama and make an appointment to see him soon about your memory and weight loss  You will be due for another chest CT in late August of this year  You will be due for a CT angiogram of your aorta in March of 2018  Drink an Ensure or a Boost every day, every single day  Follow-up with [name], NP in 6 weeks

## 2015-09-04 DIAGNOSIS — F0150 Vascular dementia without behavioral disturbance: Secondary | ICD-10-CM | POA: Diagnosis not present

## 2015-09-04 DIAGNOSIS — G44229 Chronic tension-type headache, not intractable: Secondary | ICD-10-CM | POA: Diagnosis not present

## 2015-09-04 DIAGNOSIS — R4701 Aphasia: Secondary | ICD-10-CM | POA: Diagnosis not present

## 2015-09-04 DIAGNOSIS — F0280 Dementia in other diseases classified elsewhere without behavioral disturbance: Secondary | ICD-10-CM | POA: Diagnosis not present

## 2015-09-04 DIAGNOSIS — G309 Alzheimer's disease, unspecified: Secondary | ICD-10-CM | POA: Diagnosis not present

## 2015-09-04 DIAGNOSIS — R5383 Other fatigue: Secondary | ICD-10-CM | POA: Diagnosis not present

## 2015-09-12 ENCOUNTER — Other Ambulatory Visit: Payer: Self-pay | Admitting: Unknown Physician Specialty

## 2015-09-12 NOTE — Telephone Encounter (Signed)
Your patient.  Thanks 

## 2015-09-12 NOTE — Telephone Encounter (Signed)
I have left the practice, and my understanding was that refills would go to provider who will be following patient from here on out Regardless, I'll be happy to fill this one Oct and Dec 2016 labs reviewed; rx approved

## 2015-09-12 NOTE — Telephone Encounter (Signed)
This was a Dr. Sanda Klein patient, but she has an appointment with you 10/12/15. Pharmacy is [facility].

## 2015-09-12 NOTE — Telephone Encounter (Signed)
Your patient 

## 2015-09-15 DIAGNOSIS — M4310 Spondylolisthesis, site unspecified: Secondary | ICD-10-CM | POA: Insufficient documentation

## 2015-09-15 NOTE — Assessment & Plan Note (Signed)
Reviewed findings from recent CT with patient and husband; 7 mm pulmonary nodule, lingula; noted 01/2014 scan; next (and last) CT scan August 2017 will complete two years of stability on imaging

## 2015-09-15 NOTE — Assessment & Plan Note (Signed)
Noted on CT scan March 2017; reviewed with patient and husband at visit; explained yearly follow-up recommended with CTA or MRA once a year, next due March 2018

## 2015-09-15 NOTE — Assessment & Plan Note (Signed)
Patient is aware of coronary atherosclerosis and had a stent place in 2011; f/u with cardiologist

## 2015-09-15 NOTE — Assessment & Plan Note (Signed)
On statin; reviewed last lipids; next fasting cholesterol panel and sgpt due on or after Oct 08, 2015

## 2015-09-15 NOTE — Assessment & Plan Note (Signed)
With alteration in sense of smell and taste; patient with modest weight loss and no pathologic cause, suspecting dementia playing a role in these; I really encouraged her to please see her neurologist; this was clearly explained to patient and her husband who is present, and written on after visit summary; he is in the care team, so his phone number is on her check out sheet/AVS

## 2015-09-15 NOTE — Assessment & Plan Note (Signed)
Controlled with a systolic less than Q000111Q mmHg today

## 2015-09-15 NOTE — Assessment & Plan Note (Signed)
Patient has seen surgeon; no plans for operative management at this time

## 2015-09-15 NOTE — Assessment & Plan Note (Signed)
Last TSH in October was normal; next due in October 2017, sooner if more significant weight change or other s/s of over- or under-replacement

## 2015-09-15 NOTE — Assessment & Plan Note (Signed)
C3 to C4, and C4 to C5; patient seen by neurosurgeon; patient declined surgery; asymptomatic at this time

## 2015-09-26 DIAGNOSIS — D485 Neoplasm of uncertain behavior of skin: Secondary | ICD-10-CM | POA: Diagnosis not present

## 2015-10-03 ENCOUNTER — Encounter: Payer: Self-pay | Admitting: Family Medicine

## 2015-10-03 DIAGNOSIS — N183 Chronic kidney disease, stage 3 unspecified: Secondary | ICD-10-CM | POA: Insufficient documentation

## 2015-10-04 ENCOUNTER — Encounter: Payer: Self-pay | Admitting: Family Medicine

## 2015-10-12 ENCOUNTER — Ambulatory Visit (INDEPENDENT_AMBULATORY_CARE_PROVIDER_SITE_OTHER): Payer: Medicare Other | Admitting: Unknown Physician Specialty

## 2015-10-12 ENCOUNTER — Encounter: Payer: Self-pay | Admitting: Unknown Physician Specialty

## 2015-10-12 VITALS — BP 139/83 | HR 54 | Temp 97.8°F | Ht 60.0 in | Wt 117.2 lb

## 2015-10-12 DIAGNOSIS — I25810 Atherosclerosis of coronary artery bypass graft(s) without angina pectoris: Secondary | ICD-10-CM

## 2015-10-12 DIAGNOSIS — M25512 Pain in left shoulder: Secondary | ICD-10-CM | POA: Diagnosis not present

## 2015-10-12 DIAGNOSIS — I2510 Atherosclerotic heart disease of native coronary artery without angina pectoris: Secondary | ICD-10-CM | POA: Diagnosis not present

## 2015-10-12 DIAGNOSIS — M25519 Pain in unspecified shoulder: Secondary | ICD-10-CM | POA: Insufficient documentation

## 2015-10-12 DIAGNOSIS — M545 Low back pain, unspecified: Secondary | ICD-10-CM | POA: Insufficient documentation

## 2015-10-12 DIAGNOSIS — R634 Abnormal weight loss: Secondary | ICD-10-CM

## 2015-10-12 DIAGNOSIS — F0390 Unspecified dementia without behavioral disturbance: Secondary | ICD-10-CM

## 2015-10-12 DIAGNOSIS — J309 Allergic rhinitis, unspecified: Secondary | ICD-10-CM | POA: Diagnosis not present

## 2015-10-12 DIAGNOSIS — M25561 Pain in right knee: Secondary | ICD-10-CM | POA: Diagnosis not present

## 2015-10-12 MED ORDER — DONEPEZIL HCL 5 MG PO TABS: 5.0000 mg | ORAL_TABLET | Freq: Every day | ORAL | Status: DC

## 2015-10-12 MED ORDER — IPRATROPIUM BROMIDE 0.03 % NA SOLN: 2.0000 | Freq: Two times a day (BID) | NASAL | Status: DC

## 2015-10-12 NOTE — Assessment & Plan Note (Addendum)
STEROID INJECTION  Procedure: Knee Intraarticular Steroid Injection   Description: After verbal consent and patient ed on Area prepped and draped using  semi-sterile technique. Using a anterior  approach, a mixture of 4 cc of  1% Marcaine & 1 cc of Kenalog 40 was injected into knee joint.  A bandage was then placed over the injection site. Complications:  none Post Procedure Instructions: To the ER if any symptoms of erythema or swelling.

## 2015-10-12 NOTE — Progress Notes (Signed)
BP 139/83 mmHg  Pulse 54  Temp(Src) 97.8 F (36.6 C)  Ht 5' (1.524 m)  Wt 117 lb 3.2 oz (53.162 kg)  BMI 22.89 kg/m2  SpO2 95%  LMP  (LMP Unknown)   Subjective:    Patient ID: Felicia Terry, female    DOB: 1929-12-16, 82 y.o.   MRN: [id number]  HPI: Felicia Terry is a 82 y.o. female  Chief Complaint  Patient presents with  . Follow-up    6 month f/u, former Dr. Sanda Klein patient   This is a new patient for me with the following complaints: In general, she is a poor historian as cannot follow a train of thought  Joint pain  Having right knee pain that was helped with an injection about 3 years ago.  States she stays awake "all night." she is also having left shoulder pain upper back and low back pain.    Memory problems Trouble with her memory.  Went to Dr. Melrose Nakayama and given medication but unable to tolerate due to dreams.  She thinks it was Donepezil.  She and her husband are both poor historian  Weight Losing weight.  Husband states she is eating better but has a poor appetite  Hypertension Good control today.  No chest pain or SOB  CAD Has seen Dr. Clayborn Bigness.    Cough Has a bad cough.  Intermittent cough that is dry and coughs up phlegm.    Insomnia Has difficulty sleeping at night.  She does admit to napping during the day  Relevant past medical, surgical, family and social history reviewed and updated as indicated. Interim medical history since our last visit reviewed. Allergies and medications reviewed and updated.  Review of Systems  Per HPI unless specifically indicated above     Objective:    BP 139/83 mmHg  Pulse 54  Temp(Src) 97.8 F (36.6 C)  Ht 5' (1.524 m)  Wt 117 lb 3.2 oz (53.162 kg)  BMI 22.89 kg/m2  SpO2 95%  LMP  (LMP Unknown)  Wt Readings from Last 3 Encounters:  10/12/15 117 lb 3.2 oz (53.162 kg)  08/31/15 119 lb (53.978 kg)  07/07/15 120 lb (54.432 kg)    Physical Exam  Constitutional: She is oriented to person, place, and  time. She appears well-developed and well-nourished. No distress.  HENT:  Head: Normocephalic and atraumatic.  Nose: Mucosal edema and rhinorrhea present.  Mouth/Throat: Uvula is midline and oropharynx is clear and moist. No oropharyngeal exudate.  Eyes: Conjunctivae and lids are normal. Right eye exhibits no discharge. Left eye exhibits no discharge. No scleral icterus.  Neck: Normal range of motion. Neck supple. No JVD present. Carotid bruit is not present.  Cardiovascular: Normal rate, regular rhythm and normal heart sounds.   Pulmonary/Chest: Effort normal and breath sounds normal.  Abdominal: Normal appearance. There is no splenomegaly or hepatomegaly.  Musculoskeletal: Normal range of motion.  Neurological: She is alert and oriented to person, place, and time.  Skin: Skin is warm, dry and intact. No rash noted. No pallor.  Psychiatric: She has a normal mood and affect. Her behavior is normal. Judgment and thought content normal.    Results for orders placed or performed during the hospital encounter of 08/20/15  I-STAT creatinine  Result Value Ref Range   Creatinine, Ser 0.80 0.44 - 1.00 mg/dL      Assessment & Plan:   Problem List Items Addressed This Visit      Unprioritized   Allergic rhinitis  Causing cough.  Rx for Atrovent nasal spray      CAD (coronary artery disease)    Trial off of Pravastatin to see if it helps memory and/or appetite      Dementia - Primary    Restart Aricept but take it in the AM due to bad dreams.        Relevant Medications   donepezil (ARICEPT) 5 MG tablet   Low back pain   Relevant Medications   acetaminophen (TYLENOL ARTHRITIS PAIN) 650 MG CR tablet   Other Relevant Orders   Ambulatory referral to Physical Therapy   Right knee pain    STEROID INJECTION  Procedure: Knee Intraarticular Steroid Injection   Description: After verbal consent and patient ed on Area prepped and draped using  semi-sterile technique. Using a anterior   approach, a mixture of 4 cc of  1% Marcaine & 1 cc of Kenalog 40 was injected into knee joint.  A bandage was then placed over the injection site. Complications:  none Post Procedure Instructions: To the ER if any symptoms of erythema or swelling.          Shoulder pain, left   Relevant Orders   Ambulatory referral to Physical Therapy   Weight loss    Lately appetite better.  Monitor q 3 months.  Consider Remeron         PT for shoulder and back  The following written instruction were given:  Take Aricept (Donepiizil) in the AM rather than PM  Don't sleep during the day as it will keep you from sleeping at night.    Stop Pravastatin until next time you see me to see if it helps with memory  Start nasal spray as directed as nasal allergies are causing cough  Got to PT for shoulder and back.  Call [facility].  Information given to you   Follow up plan: Return in about 6 weeks (around 11/23/2015).

## 2015-10-12 NOTE — Assessment & Plan Note (Signed)
Causing cough.  Rx for Atrovent nasal spray

## 2015-10-12 NOTE — Assessment & Plan Note (Signed)
Trial off of Pravastatin to see if it helps memory and/or appetite

## 2015-10-12 NOTE — Assessment & Plan Note (Signed)
Restart Aricept but take it in the AM due to bad dreams.

## 2015-10-12 NOTE — Assessment & Plan Note (Addendum)
Lately appetite better.  Monitor q 3 months.  Consider Remeron

## 2015-10-12 NOTE — Patient Instructions (Signed)
Take Aricept (Donepiizil) in the AM rather than PM  Don't sleep during the day as it will keep you from sleeping at night.    Stop Pravastatin until next time you see me to see if it helps with memory  Start nasal spray as directed as nasal allergies are causing cough

## 2015-11-23 ENCOUNTER — Encounter: Payer: Self-pay | Admitting: Unknown Physician Specialty

## 2015-11-23 ENCOUNTER — Ambulatory Visit (INDEPENDENT_AMBULATORY_CARE_PROVIDER_SITE_OTHER): Payer: Medicare Other | Admitting: Unknown Physician Specialty

## 2015-11-23 VITALS — BP 139/78 | HR 58 | Temp 97.6°F | Ht 60.5 in | Wt 117.0 lb

## 2015-11-23 DIAGNOSIS — M545 Low back pain: Secondary | ICD-10-CM

## 2015-11-23 DIAGNOSIS — F0390 Unspecified dementia without behavioral disturbance: Secondary | ICD-10-CM

## 2015-11-23 DIAGNOSIS — I2510 Atherosclerotic heart disease of native coronary artery without angina pectoris: Secondary | ICD-10-CM | POA: Diagnosis not present

## 2015-11-23 DIAGNOSIS — M25512 Pain in left shoulder: Secondary | ICD-10-CM

## 2015-11-23 MED ORDER — DONEPEZIL HCL 10 MG PO TABS: 10.0000 mg | ORAL_TABLET | Freq: Every day | ORAL | Status: DC

## 2015-11-23 NOTE — Progress Notes (Signed)
BP 139/78 mmHg  Pulse 58  Temp(Src) 97.6 F (36.4 C)  Ht 5' 0.5" (1.537 m)  Wt 117 lb (53.071 kg)  BMI 22.47 kg/m2  SpO2 96%  LMP  (LMP Unknown)   Subjective:    Patient ID: Felicia Terry, female    DOB: [birth year], 82 y.o.   MRN: [id number]  HPI: Felicia Terry is a 82 y.o. female  Chief Complaint  Patient presents with  . Dementia    6 week f/u   Dementia Pt is here with husband for f/u dementia.  Last visit we started her on Donepezil and stopped Pravastatin.  Her appetite is better.  No side effects from Donepezil.    Right knee Using a brace which helps.  Last injection was about 6 weeks ago.  States that works well.    Left shoulder Having some pain and find she is rubbing it a lot.  Using Aspercream.  Reviewed Creatnine which is OK.    Relevant past medical, surgical, family and social history reviewed and updated as indicated. Interim medical history since our last visit reviewed. Allergies and medications reviewed and updated.  Review of Systems  Per HPI unless specifically indicated above     Objective:    BP 139/78 mmHg  Pulse 58  Temp(Src) 97.6 F (36.4 C)  Ht 5' 0.5" (1.537 m)  Wt 117 lb (53.071 kg)  BMI 22.47 kg/m2  SpO2 96%  LMP  (LMP Unknown)  Wt Readings from Last 3 Encounters:  11/23/15 117 lb (53.071 kg)  10/12/15 117 lb 3.2 oz (53.162 kg)  08/31/15 119 lb (53.978 kg)    Physical Exam  Constitutional: She appears well-developed and well-nourished.  HENT:  Head: Normocephalic and atraumatic.  Eyes: Pupils are equal, round, and reactive to light. Right eye exhibits no discharge. Left eye exhibits no discharge. No scleral icterus.  Neck: Normal range of motion. Neck supple. Carotid bruit is not present. No thyromegaly present.  Cardiovascular: Normal rate, regular rhythm and normal heart sounds.  Exam reveals no gallop and no friction rub.   No murmur heard. Pulmonary/Chest: Effort normal and breath sounds normal. No respiratory  distress. She has no wheezes. She has no rales.  Abdominal: Soft. Bowel sounds are normal. There is no tenderness. There is no rebound.  Genitourinary: No breast swelling, tenderness or discharge.  Musculoskeletal: Normal range of motion.       Left shoulder: She exhibits tenderness, bony tenderness, pain and decreased strength. She exhibits no swelling.  Lymphadenopathy:    She has no cervical adenopathy.  Neurological: She is alert.  Skin: Skin is warm, dry and intact. No rash noted.  Psychiatric: Her speech is delayed. Cognition and memory are impaired.    Results for orders placed or performed during the hospital encounter of 08/20/15  I-STAT creatinine  Result Value Ref Range   Creatinine, Ser 0.80 0.44 - 1.00 mg/dL      Assessment & Plan:   Problem List Items Addressed This Visit      Unprioritized   Dementia    Increase Donepezil to 10 mg      Relevant Medications   donepezil (ARICEPT) 10 MG tablet   Low back pain    Chronic problem.  Recommended Lidoderm patches      Shoulder pain, left    Get x-ray of left shoulder at the [facility] office Get Lidoderm patches for left shoulder pain.  OTC at the drug store Consider injection next visit  Other Visit Diagnoses    Left shoulder pain    -  Primary    Relevant Orders    DG Shoulder Left         Follow up plan: Return in about 6 weeks (around 01/04/2016).

## 2015-11-23 NOTE — Patient Instructions (Addendum)
Get x-ray of left shoulder at the [facility] office.  They will not be open till Tuesday Increase Donepezil to 10 mg.   Get Lidoderm patches for left shoulder pain.  OTC at the drug store

## 2015-11-23 NOTE — Assessment & Plan Note (Signed)
Increase Donepezil to 10 mg

## 2015-11-23 NOTE — Assessment & Plan Note (Signed)
Chronic problem.  Recommended Lidoderm patches

## 2015-11-23 NOTE — Assessment & Plan Note (Signed)
Get x-ray of left shoulder at the [facility] office Get Lidoderm patches for left shoulder pain.  OTC at the drug store Consider injection next visit

## 2015-11-27 ENCOUNTER — Ambulatory Visit
Admission: RE | Admit: 2015-11-27 | Discharge: 2015-11-27 | Disposition: A | Discharge status: Home / Self Care | Payer: Medicare Other | Source: Ambulatory Visit | Attending: Unknown Physician Specialty | Admitting: Unknown Physician Specialty

## 2015-11-27 DIAGNOSIS — M19012 Primary osteoarthritis, left shoulder: Secondary | ICD-10-CM | POA: Insufficient documentation

## 2015-11-27 DIAGNOSIS — M25512 Pain in left shoulder: Secondary | ICD-10-CM | POA: Diagnosis present

## 2015-12-28 ENCOUNTER — Other Ambulatory Visit: Payer: Self-pay | Admitting: Family Medicine

## 2016-01-04 ENCOUNTER — Encounter: Payer: Self-pay | Admitting: Unknown Physician Specialty

## 2016-01-04 ENCOUNTER — Ambulatory Visit (INDEPENDENT_AMBULATORY_CARE_PROVIDER_SITE_OTHER): Payer: Medicare Other | Admitting: Unknown Physician Specialty

## 2016-01-04 DIAGNOSIS — I2510 Atherosclerotic heart disease of native coronary artery without angina pectoris: Secondary | ICD-10-CM | POA: Diagnosis not present

## 2016-01-04 DIAGNOSIS — F0390 Unspecified dementia without behavioral disturbance: Secondary | ICD-10-CM | POA: Diagnosis not present

## 2016-01-04 DIAGNOSIS — R634 Abnormal weight loss: Secondary | ICD-10-CM | POA: Diagnosis not present

## 2016-01-04 DIAGNOSIS — I10 Essential (primary) hypertension: Secondary | ICD-10-CM

## 2016-01-04 DIAGNOSIS — M25512 Pain in left shoulder: Secondary | ICD-10-CM

## 2016-01-04 NOTE — Assessment & Plan Note (Signed)
Continue Donepezil.  Lives with husband and grandson.

## 2016-01-04 NOTE — Patient Instructions (Signed)
Drink some high calorie supplements.  Things like Boost or Ensure are good choices.  You can make milkshakes with ice cream and milk.  Don't limit your food choices.

## 2016-01-04 NOTE — Assessment & Plan Note (Signed)
Drink some high calorie supplements.  Things like Boost or Ensure are good choices.  You can make milkshakes with ice cream and milk.  Don't limit your food choices.

## 2016-01-04 NOTE — Assessment & Plan Note (Signed)
Stable, continue present medications. ? If taking Lisinopril.  Good control now.  Will continue to monitor and review medication

## 2016-01-04 NOTE — Progress Notes (Signed)
BP 121/76 (BP Location: Left Arm, Patient Position: Sitting, Cuff Size: Small)   Pulse 65   Temp 98.1 F (36.7 C)   Wt 115 lb (52.2 kg)   LMP  (LMP Unknown)   SpO2 (!) 65%   BMI 22.09 kg/m    Subjective:    Patient ID: Felicia Terry, female    DOB: 08-30-1929, 82 y.o.   MRN: [id number]  HPI: Felicia Terry is a 82 y.o. female  No chief complaint on file.  Pt is here with dementia who gives part of the history.  She lives with her husband and grandson  Weight loss C/o 2 pound weight loss since last visit 6 weeks ago.  She and her husband states she has a good appetite.  She is mostly sleeping well.    Left shoulder pain "I want you to give me a shot."  OA noted on x-ray.  Has difficulty moving her shoulder around  Back pain Has some back pain right lower back.    Hypertension Using medications without difficulty but not sure if she is taking Lisinopril.  Both she and her husband are vague about medications.   Average home BPs Not checking  No problems or lightheadedness No chest pain with exertion or shortness of breath No Edema  Dementia Taking Donepezil.     Relevant past medical, surgical, family and social history reviewed and updated as indicated. Interim medical history since our last visit reviewed. Allergies and medications reviewed and updated.  Review of Systems  Per HPI unless specifically indicated above     Objective:    BP 121/76 (BP Location: Left Arm, Patient Position: Sitting, Cuff Size: Small)   Pulse 65   Temp 98.1 F (36.7 C)   Wt 115 lb (52.2 kg)   LMP  (LMP Unknown)   SpO2 (!) 65%   BMI 22.09 kg/m   Wt Readings from Last 3 Encounters:  01/04/16 115 lb (52.2 kg)  11/23/15 117 lb (53.1 kg)  10/12/15 117 lb 3.2 oz (53.2 kg)    Physical Exam  Constitutional: She is oriented to person, place, and time. She appears well-developed and well-nourished. No distress.  HENT:  Head: Normocephalic and atraumatic.  Eyes: Conjunctivae  and lids are normal. Right eye exhibits no discharge. Left eye exhibits no discharge. No scleral icterus.  Neck: Normal range of motion. Neck supple. No JVD present. Carotid bruit is not present.  Cardiovascular: Normal rate, regular rhythm and normal heart sounds.   Pulmonary/Chest: Effort normal and breath sounds normal.  Abdominal: Normal appearance. There is no splenomegaly or hepatomegaly.  Musculoskeletal:       Left shoulder: She exhibits decreased range of motion and pain. She exhibits no tenderness, no bony tenderness, no swelling, no effusion, no crepitus, no deformity, no laceration, no spasm, normal pulse and normal strength.  Neurological: She is alert and oriented to person, place, and time.  Skin: Skin is warm, dry and intact. No rash noted. No pallor.  Psychiatric: She has a normal mood and affect. Her behavior is normal. Judgment and thought content normal.   STEROID INJECTION  Procedure: Shoulder Intraarticular Steroid Injection Consent: DEL Risks, benefits, and alternative treatments discussed and all questions were answered.  Patient elected to proceed and verbal consent obtained.  Description: Area prepped and draped using   semi-sterile technique. Using a posterolateral approach a mixture of 4cc 0.5% marcaine & 1 cc Kenalog 40 injected in shoulder joint.  A bandage was then placed over  the injection site. Complications:  none    Results for orders placed or performed during the hospital encounter of 08/20/15  I-STAT creatinine  Result Value Ref Range   Creatinine, Ser 0.80 0.44 - 1.00 mg/dL      Assessment & Plan:   Problem List Items Addressed This Visit      Unprioritized   Dementia    Continue Donepezil.  Lives with husband and grandson.        Relevant Orders   Comprehensive metabolic panel   Essential hypertension, benign    Stable, continue present medications. ? If taking Lisinopril.  Good control now.  Will continue to monitor and review medication         Relevant Orders   Comprehensive metabolic panel   Shoulder pain    Injection today.  States it feels better.  May need referal to orthopedics for futher management and IA injection under ultrasound      Weight loss    Drink some high calorie supplements.  Things like Boost or Ensure are good choices.  You can make milkshakes with ice cream and milk.  Don't limit your food choices.         Other Visit Diagnoses   None.      Follow up plan: Return in about 3 months (around 04/05/2016).

## 2016-01-04 NOTE — Assessment & Plan Note (Addendum)
Injection today.  States it feels better.  May need referal to orthopedics for futher management and IA injection under ultrasound

## 2016-01-05 LAB — COMPREHENSIVE METABOLIC PANEL
ALK PHOS: 73 IU/L (ref 39–117)
ALT: 13 IU/L (ref 0–32)
AST: 23 IU/L (ref 0–40)
Albumin/Globulin Ratio: 1.5 (ref 1.2–2.2)
Albumin: 4 g/dL (ref 3.5–4.7)
BUN/Creatinine Ratio: 19 (ref 12–28)
BUN: 19 mg/dL (ref 8–27)
Bilirubin Total: 0.4 mg/dL (ref 0.0–1.2)
CO2: 27 mmol/L (ref 18–29)
CREATININE: 1.02 mg/dL — AB (ref 0.57–1.00)
Calcium: 9.2 mg/dL (ref 8.7–10.3)
Chloride: 98 mmol/L (ref 96–106)
GFR calc Af Amer: 58 mL/min/{1.73_m2} — ABNORMAL LOW (ref >59)
GFR calc non Af Amer: 51 mL/min/{1.73_m2} — ABNORMAL LOW (ref >59)
GLOBULIN, TOTAL: 2.7 g/dL (ref 1.5–4.5)
GLUCOSE: 99 mg/dL (ref 65–99)
Potassium: 4 mmol/L (ref 3.5–5.2)
SODIUM: 139 mmol/L (ref 134–144)
Total Protein: 6.7 g/dL (ref 6.0–8.5)

## 2016-01-07 ENCOUNTER — Encounter: Payer: Self-pay | Admitting: Unknown Physician Specialty

## 2016-01-14 DIAGNOSIS — H1851 Endothelial corneal dystrophy: Secondary | ICD-10-CM | POA: Diagnosis not present

## 2016-02-18 DIAGNOSIS — H6123 Impacted cerumen, bilateral: Secondary | ICD-10-CM | POA: Diagnosis not present

## 2016-02-18 DIAGNOSIS — H903 Sensorineural hearing loss, bilateral: Secondary | ICD-10-CM | POA: Diagnosis not present

## 2016-03-28 ENCOUNTER — Other Ambulatory Visit: Payer: Self-pay

## 2016-03-28 MED ORDER — LEVOTHYROXINE SODIUM 25 MCG PO TABS: 25.0000 ug | ORAL_TABLET | Freq: Every day | ORAL | 0 refills | Status: DC

## 2016-03-28 NOTE — Telephone Encounter (Signed)
Needs 90 day supply for mail order. 

## 2016-04-07 ENCOUNTER — Ambulatory Visit (INDEPENDENT_AMBULATORY_CARE_PROVIDER_SITE_OTHER): Payer: Medicare Other | Admitting: Unknown Physician Specialty

## 2016-04-07 ENCOUNTER — Encounter: Payer: Self-pay | Admitting: Unknown Physician Specialty

## 2016-04-07 VITALS — BP 151/79 | HR 59 | Temp 97.6°F | Ht 61.0 in | Wt 118.2 lb

## 2016-04-07 DIAGNOSIS — F0280 Dementia in other diseases classified elsewhere without behavioral disturbance: Secondary | ICD-10-CM

## 2016-04-07 DIAGNOSIS — M25512 Pain in left shoulder: Secondary | ICD-10-CM | POA: Diagnosis not present

## 2016-04-07 DIAGNOSIS — F321 Major depressive disorder, single episode, moderate: Secondary | ICD-10-CM

## 2016-04-07 DIAGNOSIS — I10 Essential (primary) hypertension: Secondary | ICD-10-CM

## 2016-04-07 DIAGNOSIS — I2510 Atherosclerotic heart disease of native coronary artery without angina pectoris: Secondary | ICD-10-CM | POA: Diagnosis not present

## 2016-04-07 DIAGNOSIS — G8929 Other chronic pain: Secondary | ICD-10-CM

## 2016-04-07 DIAGNOSIS — H1012 Acute atopic conjunctivitis, left eye: Secondary | ICD-10-CM

## 2016-04-07 DIAGNOSIS — R634 Abnormal weight loss: Secondary | ICD-10-CM | POA: Diagnosis not present

## 2016-04-07 DIAGNOSIS — G301 Alzheimer's disease with late onset: Secondary | ICD-10-CM

## 2016-04-07 DIAGNOSIS — Z23 Encounter for immunization: Secondary | ICD-10-CM | POA: Diagnosis not present

## 2016-04-07 MED ORDER — DONEPEZIL HCL 5 MG PO TABS: 5.0000 mg | ORAL_TABLET | Freq: Every day | ORAL | 1 refills | Status: DC

## 2016-04-07 MED ORDER — LISINOPRIL 2.5 MG PO TABS: 2.5000 mg | ORAL_TABLET | Freq: Every day | ORAL | 1 refills | Status: DC

## 2016-04-07 MED ORDER — MIRTAZAPINE 15 MG PO TABS
15.0000 mg | ORAL_TABLET | Freq: Every day | ORAL | 3 refills | Status: DC
Start: 2016-04-07 — End: 2016-05-19

## 2016-04-07 MED ORDER — CROMOLYN SODIUM 4 % OP SOLN: 1.0000 [drp] | Freq: Four times a day (QID) | OPHTHALMIC | Status: DC

## 2016-04-07 MED ORDER — CROMOLYN SODIUM 4 % OP SOLN: 1.0000 [drp] | Freq: Four times a day (QID) | OPHTHALMIC | 12 refills | Status: DC

## 2016-04-07 NOTE — Assessment & Plan Note (Signed)
Still bothering her. Will refer to orthopedics.

## 2016-04-07 NOTE — Assessment & Plan Note (Signed)
Restart lisinopril 2.5 mg

## 2016-04-07 NOTE — Assessment & Plan Note (Addendum)
Improved, but still having poor appetite. Starting Remeron for sleep and mood, this should also increase appetite.

## 2016-04-07 NOTE — Patient Instructions (Addendum)

## 2016-04-07 NOTE — Assessment & Plan Note (Addendum)
Continue Aricept at 5 mg. Did not tolerate at 10 mg. Gave her nightmares. Per patient and her husband, they see their children regularly, with one son that sees them weekly.

## 2016-04-07 NOTE — Progress Notes (Signed)
BP (!) 151/79 (BP Location: Left Arm, Cuff Size: Small)   Pulse (!) 59   Temp 97.6 F (36.4 C)   Ht 5\' 1"  (1.549 m) Comment: pt had shoes on  Wt 118 lb 3.2 oz (53.6 kg) Comment: pt had shoes on  LMP  (LMP Unknown)   SpO2 97%   BMI 22.33 kg/m    Subjective:    Patient ID: Felicia Terry, female    DOB: 02-19-1930, 82 y.o.   MRN: [id number]  HPI: Felicia Terry is a 82 y.o. female  Chief Complaint  Patient presents with  . Hypertension  . Dementia  . Shoulder Pain    pt states her left shoulder is bothering her again   Hypertension  Has not been taking medication. Unsure of what happened with this.  Average home Bps   Not checking  No chest pain with exertion or shortness of breath  No Edema  Dementia Husband reports that he feels the Aricept has improved her memory.  Reports having some mood swings, between irritability and sadness.   Yolanda Bonine is still living with them, although doesn't seem to be actively helping them.  Most of their children live locally and are involved with parents, taking them out to eat and assisting with groceries on a weekly basis.     Shoulder pain Steroid injection at last visit was helpful for about 3 weeks, but is now bothering her again.   Weight loss/Appetite Has gained 3lbs since last visit but still struggling with decreased appetite.    Relevant past medical, surgical, family and social history reviewed and updated as indicated. Interim medical history since our last visit reviewed. Allergies and medications reviewed and updated.  Review of Systems  Constitutional: Negative.   Respiratory: Negative.   Cardiovascular: Negative.   Gastrointestinal: Negative.   Psychiatric/Behavioral: Positive for confusion.    Per HPI unless specifically indicated above     Objective:    BP (!) 151/79 (BP Location: Left Arm, Cuff Size: Small)   Pulse (!) 59   Temp 97.6 F (36.4 C)   Ht 5\' 1"  (1.549 m) Comment: pt had shoes on  Wt  118 lb 3.2 oz (53.6 kg) Comment: pt had shoes on  LMP  (LMP Unknown)   SpO2 97%   BMI 22.33 kg/m   Wt Readings from Last 3 Encounters:  04/07/16 118 lb 3.2 oz (53.6 kg)  01/04/16 115 lb (52.2 kg)  11/23/15 117 lb (53.1 kg)    Physical Exam  Constitutional: She is oriented to person, place, and time. She appears well-developed and well-nourished. No distress.  Cardiovascular: Normal rate, regular rhythm and normal heart sounds.   Pulmonary/Chest: Effort normal and breath sounds normal.  Neurological: She is alert and oriented to person, place, and time.  Psychiatric: Cognition and memory are impaired.    Results for orders placed or performed in visit on 01/04/16  Comprehensive metabolic panel  Result Value Ref Range   Glucose 99 65 - 99 mg/dL   BUN 19 8 - 27 mg/dL   Creatinine, Ser 1.02 (H) 0.57 - 1.00 mg/dL   GFR calc non Af Amer 51 (L) >59 mL/min/1.73   GFR calc Af Amer 58 (L) >59 mL/min/1.73   BUN/Creatinine Ratio 19 12 - 28   Sodium 139 134 - 144 mmol/L   Potassium 4.0 3.5 - 5.2 mmol/L   Chloride 98 96 - 106 mmol/L   CO2 27 18 - 29 mmol/L   Calcium 9.2 8.7 -  10.3 mg/dL   Total Protein 6.7 6.0 - 8.5 g/dL   Albumin 4.0 3.5 - 4.7 g/dL   Globulin, Total 2.7 1.5 - 4.5 g/dL   Albumin/Globulin Ratio 1.5 1.2 - 2.2   Bilirubin Total 0.4 0.0 - 1.2 mg/dL   Alkaline Phosphatase 73 39 - 117 IU/L   AST 23 0 - 40 IU/L   ALT 13 0 - 32 IU/L      Assessment & Plan:   Problem List Items Addressed This Visit      Nervous and Auditory   Dementia    Continue Aricept at 5 mg. Did not tolerate at 10 mg. Gave her nightmares.      Relevant Medications   donepezil (ARICEPT) 5 MG tablet   mirtazapine (REMERON) 15 MG tablet     Other   Weight loss    Improved, but still having poor appetite. Starting Remeron for sleep and mood, this should also increase appetite.       Shoulder pain    Still bothering her. Will refer to orthopedics.      Relevant Orders   Ambulatory referral  to Orthopedic Surgery    Other Visit Diagnoses    Need for influenza vaccination    -  Primary   Relevant Orders   Flu vaccine HIGH DOSE PF (Completed)   Moderate single current episode of major depressive disorder (HCC)       Relevant Medications   mirtazapine (REMERON) 15 MG tablet   Essential hypertension       Relevant Medications   lisinopril (PRINIVIL,ZESTRIL) 2.5 MG tablet   Allergic conjunctivitis of left eye           Follow up plan: Return in about 6 weeks (around 05/19/2016) for follow up .

## 2016-04-11 DIAGNOSIS — M19012 Primary osteoarthritis, left shoulder: Secondary | ICD-10-CM | POA: Diagnosis not present

## 2016-04-24 IMAGING — RF DG ESOPHAGUS
13 series · 14 of 14 positions shown · non-contrast
Comparison: None in PACs

CLINICAL DATA: Intermittent difficulty swallowing solid foods and
pills over the past 6 months

EXAM:
ESOPHOGRAM / BARIUM SWALLOW / BARIUM TABLET STUDY
TECHNIQUE: Combined double contrast and single contrast examination performed
using effervescent crystals, thick barium liquid, and thin barium
liquid. The patient was observed with fluoroscopy swallowing a 13 mm
barium sulphate tablet.
FLUOROSCOPY TIME:  Fluoroscopy Time:  1 minutes, 48 seconds
Number of Acquired Images:  13

[Series 1: fluoro_barium 2fps_bw · 0.18mm/px · 2 of 2 frames shown (1 of 13)]
[frame 1/2]
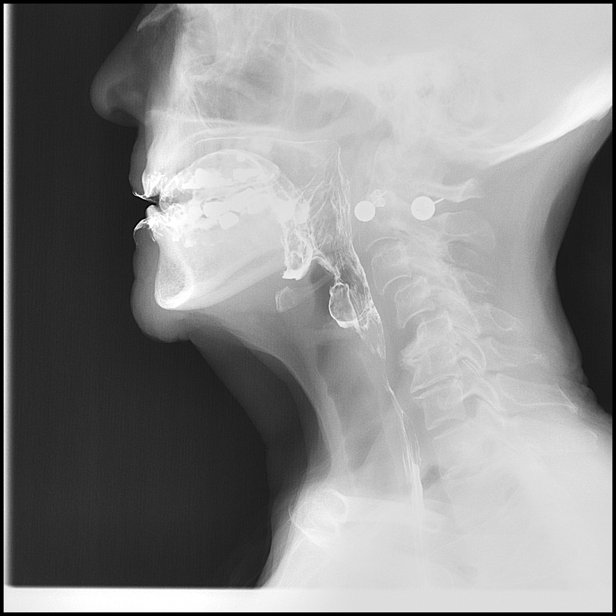
[frame 2/2]
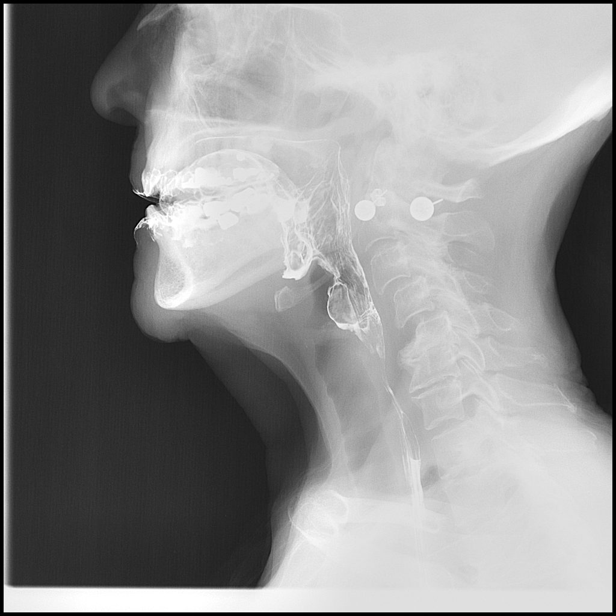

[Series 2: fluoro_barium 2fps_bw · 0.18mm/px · 1 of 1 slices shown (2 of 13)]
[im 1/1]
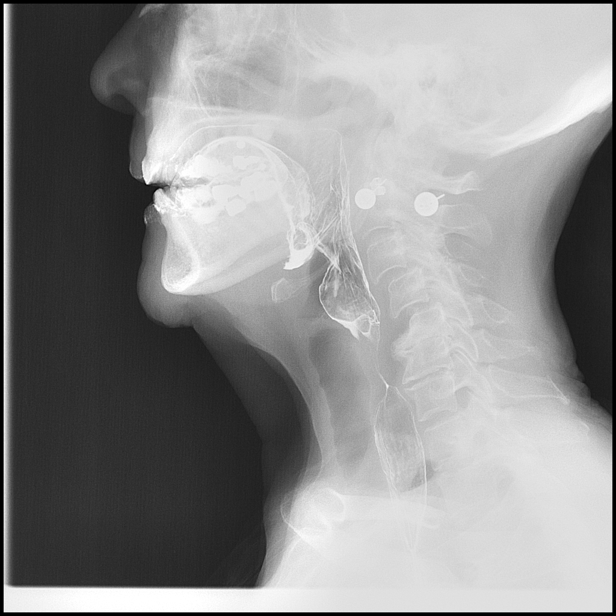

[Series 3: fluoro_barium 2fps_bw · 0.18mm/px · 1 of 1 slices shown (3 of 13)]
[im 1/1]
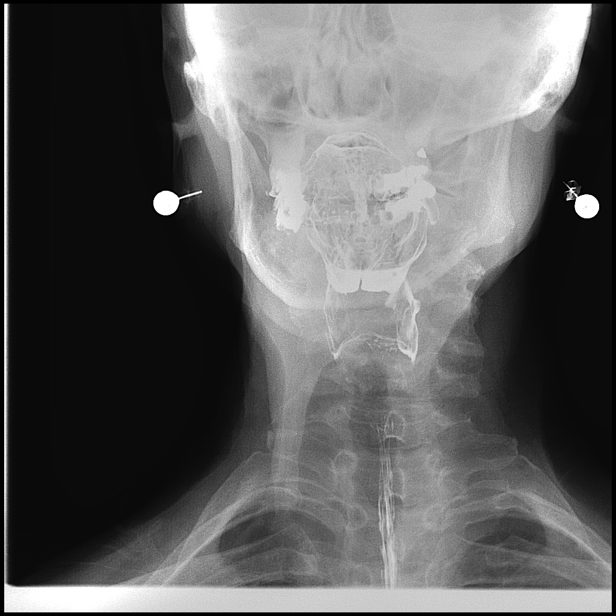

[Series 4: fluoro_barium 2fps_bw · 0.18mm/px · 1 of 1 slices shown (4 of 13)]
[im 1/1]
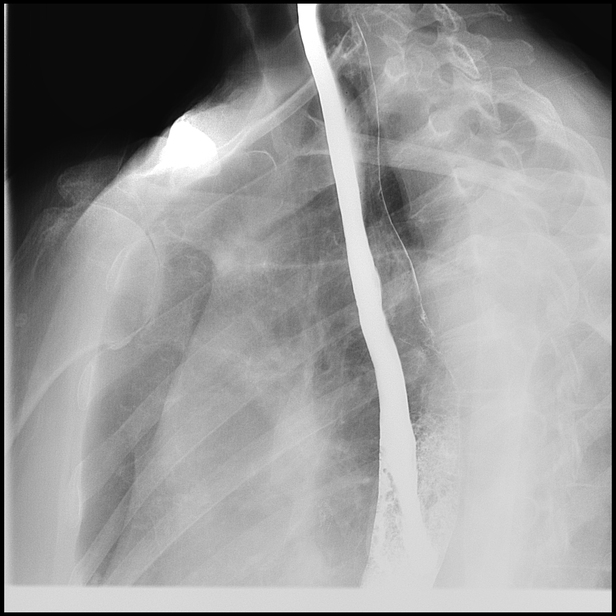

[Series 5: fluoro_barium 2fps_bw · 0.18mm/px · 1 of 1 slices shown (5 of 13)]
[im 1/1]
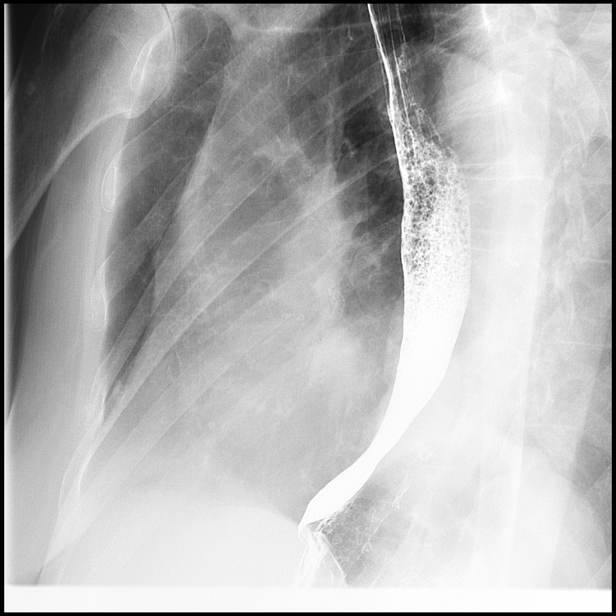

[Series 6: fluoro_barium 2fps_bw · 0.18mm/px · 1 of 1 slices shown (6 of 13)]
[im 1/1]
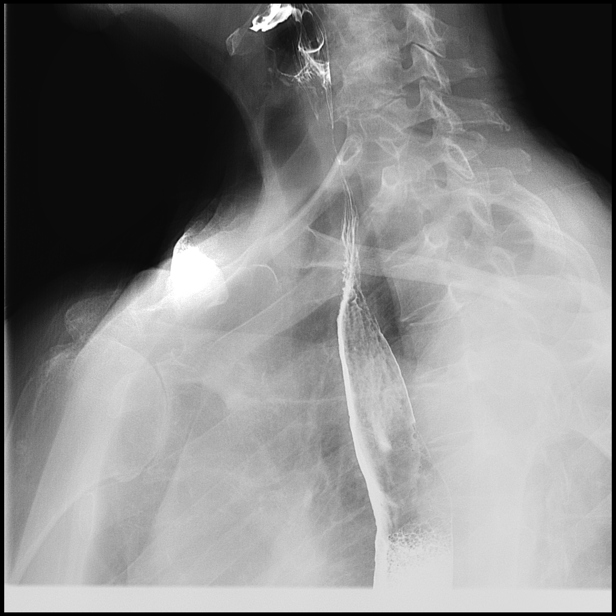

[Series 7: fluoro_barium 2fps_bw · 0.18mm/px · 1 of 1 slices shown (7 of 13)]
[im 1/1]
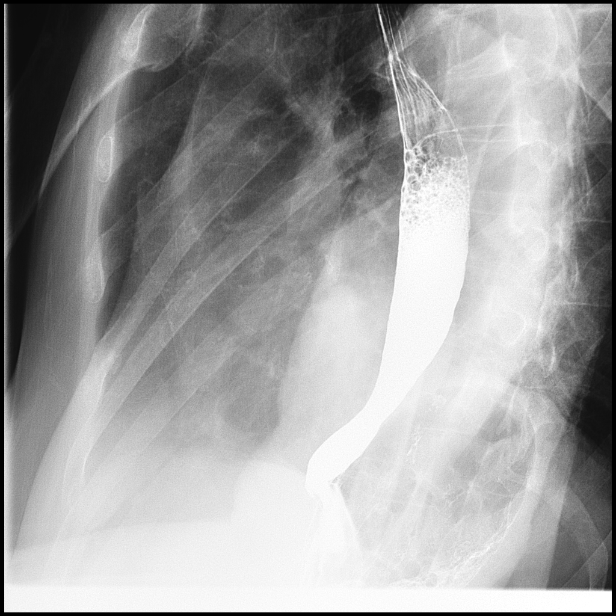

[Series 8: fluoro_barium 2fps_bw · 0.18mm/px · 1 of 1 slices shown (8 of 13)]
[im 1/1]
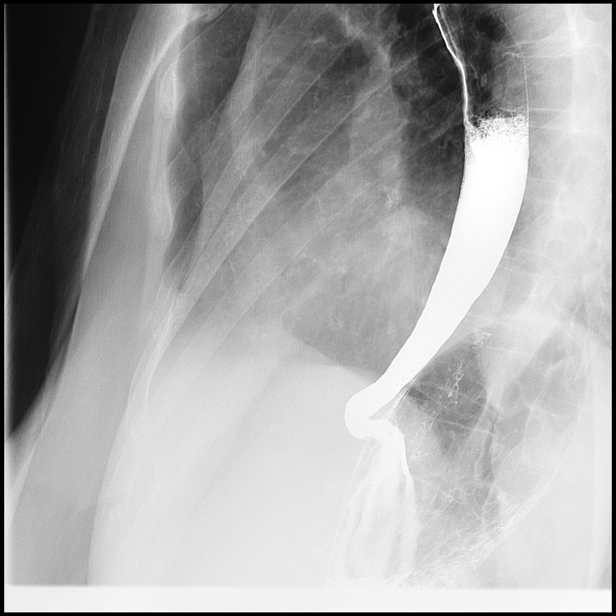

[Series 9: fluoro_barium 2fps_bw · 0.18mm/px · 1 of 1 slices shown (9 of 13)]
[im 1/1]
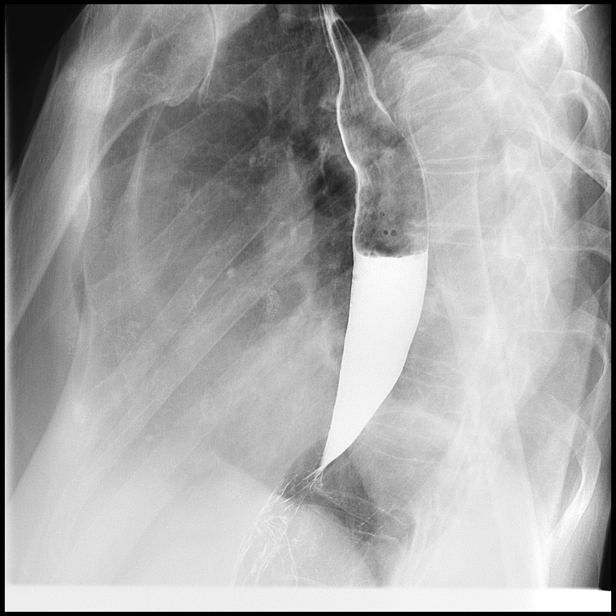

[Series 10: fluoro_barium 2fps_bw · 0.18mm/px · 1 of 1 slices shown (10 of 13)]
[im 1/1]
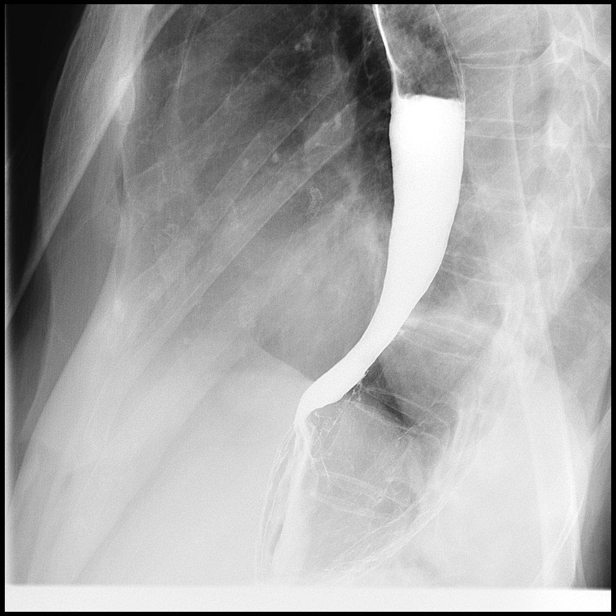

[Series 11: fluoro_barium 2fps_bw · 0.18mm/px · 1 of 1 slices shown (11 of 13)]
[im 1/1]
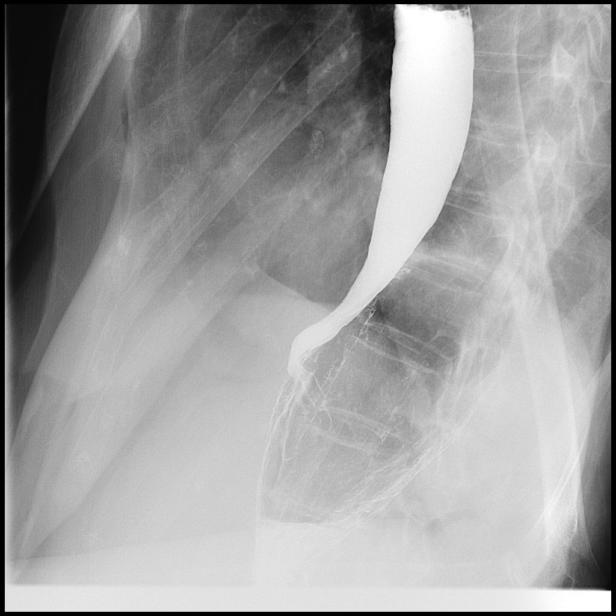

[Series 12: fluoro_barium 2fps_bw · 0.18mm/px · 1 of 1 slices shown (12 of 13)]
[im 1/1]
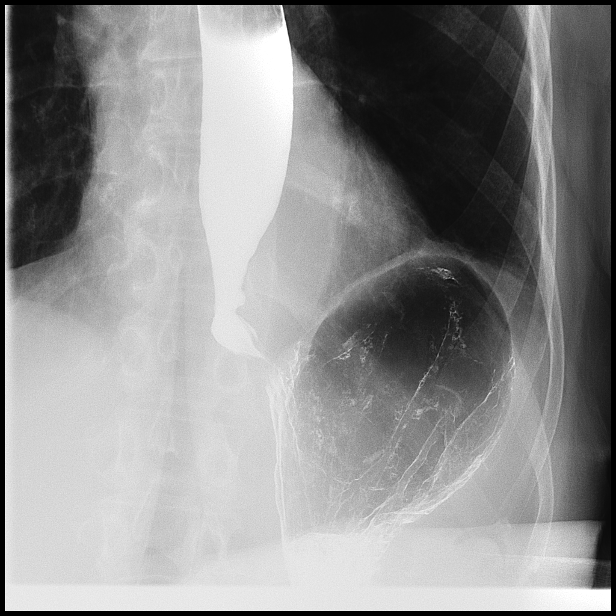

[Series 13: fluoro_barium 2fps_bw · 0.18mm/px · 1 of 1 slices shown (13 of 13)]
[im 1/1]
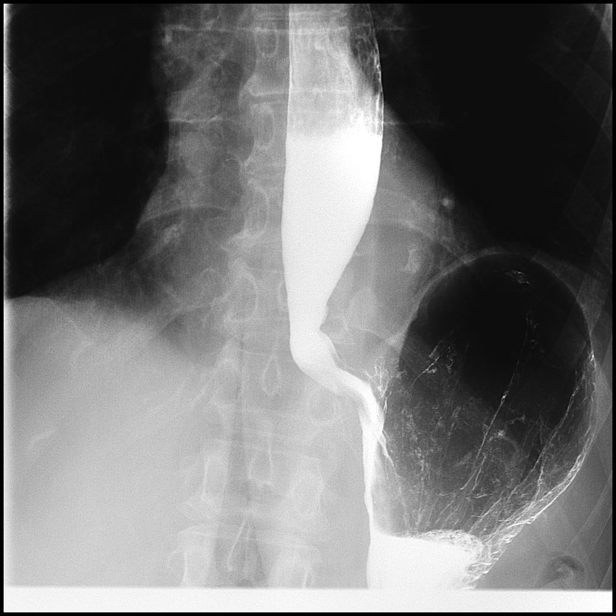

[14 of 14 positions shown; findings below may reference images not displayed]

FINDINGS: There is grade 1 anterolisthesis of C3 with respect to C4 and C4
with respect to C5. There are prominent anterior endplate
osteophytes at C5-6 and C6-7 with disc space narrowing at both
levels. These osteophytes produce a mild posterior impression upon
the proximal cervical esophagus but do not cause obstruction.

The patient ingested the thick and thin barium and gas forming
crystals without difficulty. The cervical esophagus distended well.
There was no laryngeal penetration of the barium. The thoracic
esophagus distended well. The mucosal pattern was smooth. There is
no fixed stricture nor esophagitis. The esophagus was most narrow at
the GE junction where a small reducible hiatal hernia was observed.
The barium tablet passed promptly from the mouth to the stomach. No
significant reflux was observed. No tertiary contractions were
demonstrated.
IMPRESSION: 1. No evidence of esophageal stricture nor esophagitis.
2. Small reducible hiatal hernia without significant
gastroesophageal reflux.
3. The cervical esophagus exhibited no acute abnormality.
4. Grade 1 anterolisthesis of C3 with respect to C4 and C4 with
respect to C5. Disc space narrowing and endplate osteophytes at C5-6
and C6-7. Further evaluation with a cervical spine series with
flexion and extension lateral views would be useful.

## 2016-04-28 IMAGING — CT CT CHEST W/ CM
1 series · 1 of 1 positions shown · IV contrast (omnipaque)
Comparison: Chest CT - 01/19/2014

CLINICAL DATA: Difficulty breathing.  Weakness.

EXAM:
CT CHEST WITH CONTRAST
TECHNIQUE: Multidetector CT imaging of the chest was performed during
intravenous contrast administration.
CONTRAST:  75mL OMNIPAQUE IOHEXOL 300 MG/ML  SOLN

[Series 1: topogram 1.0 t20s · coronal · 1.0mm · 1.00mm/px · 1 of 1 slices shown]
[im 1/1]
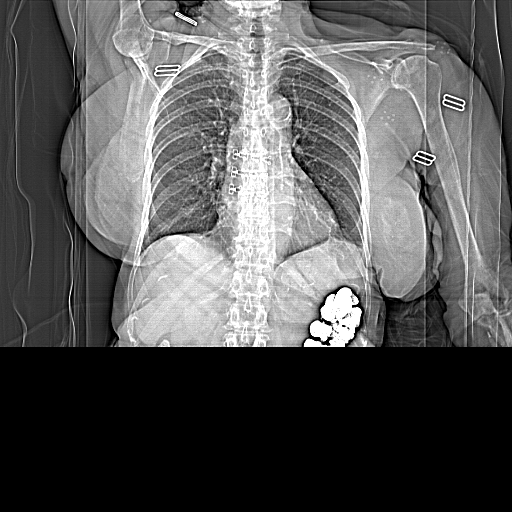

[1 of 1 positions shown; findings below may reference images not displayed]

FINDINGS: Punctate (approximately 7 mm) nodule within the lingula (image 35,
series 3) are unchanged. Previous identified and ill-defined
ground-glass subpleural nodules within the posterior aspect the
right upper lobe are less conspicuous on the present examination. No
definitive new or enlarging pulmonary nodules. No mediastinal, hilar
or axillary lymphadenopathy.

No focal airspace opacities. No pleural effusion or pneumothorax.
The central pulmonary airways appear widely patent.

Normal heart size.  Coronary artery calcifications.

Although this examination was not tailored for the evaluation the
pulmonary arteries, there are no discrete filling defects within the
central pulmonary arterial tree to suggest central pulmonary
embolism.

There is mild fusiform ectasia of the ascending thoracic aorta
measuring 38 mm in greatest oblique short axis axial dimension
(image 25, series 2) an approximately 39 mm in greatest oblique
coronal diameter (image 39, series 5), grossly unchanged since the
[DATE] examination. The thoracic aorta tapers to a normal caliber
at the level of the aortic arch. Scattered mixed calcified and
noncalcified atherosclerotic pack throughout the descending thoracic
aorta not resulting in hemodynamically significant stenosis. No
definite thoracic aortic dissection on this nongated examination.
Conventional configuration of the aortic arch. The branch vessels of
the aortic arch appear widely patent without a hemodynamically
significant stenosis.

Limited early arterial phase evaluation of the upper abdomen
demonstrates several punctate (sub cm) hypoattenuating hepatic
lesions which are too small to adequately characterized though
appear morphologically unchanged since the [DATE] examination and
are favored to represent hepatic cysts.

No acute or aggressive osseous abnormalities.

Regional soft tissues appear normal. The thyroid gland appears
diminutive but otherwise normal in appearance without discrete
nodule or mass.
IMPRESSION: 1. Punctate (approximately 7 mm) lingular pulmonary nodule is
unchanged since the [DATE] examination. This examination documents
19 months of stability. Follow-up examination after [place] of this
year would ensure at least 2 years of stability and thus a benign
etiology.
2. No new or enlarging pulmonary nodules.
3. Atherosclerosis including coronary artery calcifications.
4. Unchanged mild fusiform ectasia of the thin ascending thoracic
aorta measuring 39 mm in diameter, unchanged since the [DATE]
examination. Recommend annual imaging followup by CTA or MRA. This
recommendation follows 5565
ACCF/AHA/AATS/ACR/ASA/SCA/LIV-HEGE/BIRBIL/NOMASIBULELE/TORMA Guidelines for the
Diagnosis and Management of Patients with Thoracic Aortic Disease.
Circulation. 5565; 121: [id number]

## 2016-05-19 ENCOUNTER — Encounter: Payer: Self-pay | Admitting: Unknown Physician Specialty

## 2016-05-19 ENCOUNTER — Ambulatory Visit (INDEPENDENT_AMBULATORY_CARE_PROVIDER_SITE_OTHER): Payer: Medicare Other | Admitting: Unknown Physician Specialty

## 2016-05-19 VITALS — BP 117/73 | HR 53 | Temp 97.6°F | Ht 60.6 in | Wt 118.6 lb

## 2016-05-19 DIAGNOSIS — F0280 Dementia in other diseases classified elsewhere without behavioral disturbance: Secondary | ICD-10-CM

## 2016-05-19 DIAGNOSIS — Z9181 History of falling: Secondary | ICD-10-CM | POA: Diagnosis not present

## 2016-05-19 DIAGNOSIS — I2510 Atherosclerotic heart disease of native coronary artery without angina pectoris: Secondary | ICD-10-CM

## 2016-05-19 DIAGNOSIS — I10 Essential (primary) hypertension: Secondary | ICD-10-CM

## 2016-05-19 DIAGNOSIS — G301 Alzheimer's disease with late onset: Secondary | ICD-10-CM

## 2016-05-19 DIAGNOSIS — R634 Abnormal weight loss: Secondary | ICD-10-CM

## 2016-05-19 DIAGNOSIS — M25512 Pain in left shoulder: Secondary | ICD-10-CM

## 2016-05-19 DIAGNOSIS — G8929 Other chronic pain: Secondary | ICD-10-CM

## 2016-05-19 NOTE — Assessment & Plan Note (Signed)
Stable, continue present medications.   

## 2016-05-19 NOTE — Assessment & Plan Note (Addendum)
Aggravated again.  Refer back to Orthopedics

## 2016-05-19 NOTE — Assessment & Plan Note (Signed)
Continue present.  Clear problem with assessing pt

## 2016-05-19 NOTE — Assessment & Plan Note (Signed)
Order in home PT

## 2016-05-19 NOTE — Progress Notes (Signed)
BP 117/73 (BP Location: Left Arm, Patient Position: Sitting, Cuff Size: Normal)   Pulse (!) 53   Temp 97.6 F (36.4 C)   Ht 5' 0.6" (1.539 m)   Wt 118 lb 9.6 oz (53.8 kg)   LMP  (LMP Unknown)   SpO2 98%   BMI 22.71 kg/m    Subjective:    Patient ID: Felicia Terry, female    DOB: [birth year], 82 y.o.   MRN: [id number]  HPI: Felicia Terry is a 82 y.o. female  Chief Complaint  Patient presents with  . Dementia    6 week f/up  . Weight Loss    6 week f/up  . Fall    pt states she has had 2 falls within the last 2 weeks    Pt was prescribed Remeron for her appetite and to help her sleep.  Pt and her husband are poor historians and initially said she never took it and afterwards stated she took 1 pill but did not take anymore since she slept for 1 1/2 days.    They live with their grandson but is not there much of the time.  Her son goes to the grocery store and buys them food.    Dementia- She is taking Aricept.  Her husband keeps up with her medications.    Falling- Fell twice in the last 2 weeks.  Once while getting bags out of the car and another time when plugging in an iron.  She saw Jennette Kettle PA and was better.  She is now using a salve on her shoulder but I'm not sure what it is.    Relevant past medical, surgical, family and social history reviewed and updated as indicated. Interim medical history since our last visit reviewed. Allergies and medications reviewed and updated.  Review of Systems  Per HPI unless specifically indicated above     Objective:    BP 117/73 (BP Location: Left Arm, Patient Position: Sitting, Cuff Size: Normal)   Pulse (!) 53   Temp 97.6 F (36.4 C)   Ht 5' 0.6" (1.539 m)   Wt 118 lb 9.6 oz (53.8 kg)   LMP  (LMP Unknown)   SpO2 98%   BMI 22.71 kg/m   Wt Readings from Last 3 Encounters:  05/19/16 118 lb 9.6 oz (53.8 kg)  04/07/16 118 lb 3.2 oz (53.6 kg)  01/04/16 115 lb (52.2 kg)    Physical Exam  Constitutional: She is  oriented to person, place, and time. She appears well-developed and well-nourished. No distress.  HENT:  Head: Normocephalic and atraumatic.  Eyes: Conjunctivae and lids are normal. Right eye exhibits no discharge. Left eye exhibits no discharge. No scleral icterus.  Neck: Normal range of motion. Neck supple. No JVD present. Carotid bruit is not present.  Cardiovascular: Normal rate, regular rhythm and normal heart sounds.   Pulmonary/Chest: Effort normal and breath sounds normal.  Abdominal: Normal appearance. There is no splenomegaly or hepatomegaly.  Musculoskeletal: Normal range of motion.  Neurological: She is alert and oriented to person, place, and time.  Skin: Skin is warm, dry and intact. No rash noted. No pallor.  Psychiatric: She has a normal mood and affect. Her behavior is normal. Judgment and thought content normal.    Results for orders placed or performed in visit on 01/04/16  Comprehensive metabolic panel  Result Value Ref Range   Glucose 99 65 - 99 mg/dL   BUN 19 8 - 27 mg/dL   Creatinine,  Ser 1.02 (H) 0.57 - 1.00 mg/dL   GFR calc non Af Amer 51 (L) >59 mL/min/1.73   GFR calc Af Amer 58 (L) >59 mL/min/1.73   BUN/Creatinine Ratio 19 12 - 28   Sodium 139 134 - 144 mmol/L   Potassium 4.0 3.5 - 5.2 mmol/L   Chloride 98 96 - 106 mmol/L   CO2 27 18 - 29 mmol/L   Calcium 9.2 8.7 - 10.3 mg/dL   Total Protein 6.7 6.0 - 8.5 g/dL   Albumin 4.0 3.5 - 4.7 g/dL   Globulin, Total 2.7 1.5 - 4.5 g/dL   Albumin/Globulin Ratio 1.5 1.2 - 2.2   Bilirubin Total 0.4 0.0 - 1.2 mg/dL   Alkaline Phosphatase 73 39 - 117 IU/L   AST 23 0 - 40 IU/L   ALT 13 0 - 32 IU/L      Assessment & Plan:   Problem List Items Addressed This Visit      Unprioritized   At high risk for falls    Order in home PT      Relevant Orders   Ambulatory referral to Home Health   Benign essential HTN - Primary    Stable, continue present medications.        Dementia    Continue present.  Clear  problem with assessing pt      Shoulder pain    Aggravated again.  Refer back to Orthopedics      Relevant Orders   Ambulatory referral to Orthopedic Surgery   Weight loss      Weight loss and sleep distubances seems improved.  DC Remeron which she isn't taking anyway  Needs social work referral but not eligible for [facility] management  Follow up plan: Return in about 3 months (around 08/17/2016).

## 2016-05-20 ENCOUNTER — Telehealth: Payer: Self-pay | Admitting: Unknown Physician Specialty

## 2016-05-20 NOTE — Telephone Encounter (Signed)
Is this the son I talked with yesterday?  If so, I think he was going to give me the number of his oldest brother that I could talk to.

## 2016-05-20 NOTE — Telephone Encounter (Signed)
Called and spoke to [place]. He stated that he was the one that [place] talked with yesterday. He called to give Malachy Mood his brother, Jacquelin Hawking, phone number. Dominica Severin can be reached at [phone].

## 2016-05-20 NOTE — Telephone Encounter (Signed)
Routing to provider  

## 2016-05-21 ENCOUNTER — Telehealth: Payer: Self-pay | Admitting: Unknown Physician Specialty

## 2016-05-21 NOTE — Telephone Encounter (Signed)
Called and spoke to [place]. She stated that they tried to reach out the the patient on Tuesday and patient told them that she did not want anybody coming to her house for PT. Santiago Glad states that they tried to call again today to set up a time to visit the patient and the patient hung up on them. Santiago Glad wanted to call and let us know to see if we wanted to reach out to the patient or what we want to do.

## 2016-05-21 NOTE — Telephone Encounter (Signed)
Santiago Glad with [facility] home [facility] would like to speak with either Brittney or Keri in reference to this patient.  Thank You  Santiago Glad  [phone] Santiago Glad

## 2016-05-22 NOTE — Telephone Encounter (Signed)
Discussed with son and his wife.  They are monitoring her situation.  They plan to transfer records to [facility] primary care in [place].

## 2016-05-22 NOTE — Telephone Encounter (Signed)
Called and touched base with Santiago Glad. I let her know that Malachy Mood had a discussion with the patient's son Dominica Severin and I provided Santiago Glad with Gary's phone number as requested by Malachy Mood. Santiago Glad said she was going to call him and see what she could do.

## 2016-05-22 NOTE — Telephone Encounter (Signed)
Talked to Dominica Severin and his wife Mearl Latin.  They are choosing to go to the practice at [facility].  They are working on making sure they have appropriate home care.

## 2016-05-27 DIAGNOSIS — M19012 Primary osteoarthritis, left shoulder: Secondary | ICD-10-CM | POA: Diagnosis not present

## 2016-06-20 DIAGNOSIS — I10 Essential (primary) hypertension: Secondary | ICD-10-CM | POA: Diagnosis not present

## 2016-06-20 DIAGNOSIS — N179 Acute kidney failure, unspecified: Secondary | ICD-10-CM | POA: Diagnosis not present

## 2016-06-20 DIAGNOSIS — I2510 Atherosclerotic heart disease of native coronary artery without angina pectoris: Secondary | ICD-10-CM | POA: Diagnosis not present

## 2016-06-20 DIAGNOSIS — G309 Alzheimer's disease, unspecified: Secondary | ICD-10-CM | POA: Diagnosis not present

## 2016-06-20 DIAGNOSIS — N951 Menopausal and female climacteric states: Secondary | ICD-10-CM | POA: Diagnosis not present

## 2016-06-20 DIAGNOSIS — R238 Other skin changes: Secondary | ICD-10-CM | POA: Diagnosis not present

## 2016-06-20 DIAGNOSIS — I480 Paroxysmal atrial fibrillation: Secondary | ICD-10-CM | POA: Diagnosis not present

## 2016-06-20 DIAGNOSIS — F0150 Vascular dementia without behavioral disturbance: Secondary | ICD-10-CM | POA: Diagnosis not present

## 2016-06-20 DIAGNOSIS — R4701 Aphasia: Secondary | ICD-10-CM | POA: Diagnosis not present

## 2016-06-20 DIAGNOSIS — F0280 Dementia in other diseases classified elsewhere without behavioral disturbance: Secondary | ICD-10-CM | POA: Diagnosis not present

## 2016-06-20 DIAGNOSIS — E782 Mixed hyperlipidemia: Secondary | ICD-10-CM | POA: Diagnosis not present

## 2016-06-20 DIAGNOSIS — E039 Hypothyroidism, unspecified: Secondary | ICD-10-CM | POA: Diagnosis not present

## 2016-06-20 DIAGNOSIS — R718 Other abnormality of red blood cells: Secondary | ICD-10-CM | POA: Diagnosis not present

## 2016-07-01 DIAGNOSIS — I10 Essential (primary) hypertension: Secondary | ICD-10-CM | POA: Diagnosis not present

## 2016-07-01 DIAGNOSIS — I480 Paroxysmal atrial fibrillation: Secondary | ICD-10-CM | POA: Diagnosis not present

## 2016-07-01 DIAGNOSIS — R0602 Shortness of breath: Secondary | ICD-10-CM | POA: Diagnosis not present

## 2016-07-01 DIAGNOSIS — I25118 Atherosclerotic heart disease of native coronary artery with other forms of angina pectoris: Secondary | ICD-10-CM | POA: Diagnosis not present

## 2016-07-07 DIAGNOSIS — I480 Paroxysmal atrial fibrillation: Secondary | ICD-10-CM | POA: Diagnosis not present

## 2016-07-07 DIAGNOSIS — R718 Other abnormality of red blood cells: Secondary | ICD-10-CM | POA: Diagnosis not present

## 2016-07-07 DIAGNOSIS — N179 Acute kidney failure, unspecified: Secondary | ICD-10-CM | POA: Diagnosis not present

## 2016-07-18 DIAGNOSIS — R0602 Shortness of breath: Secondary | ICD-10-CM | POA: Diagnosis not present

## 2016-07-18 DIAGNOSIS — I480 Paroxysmal atrial fibrillation: Secondary | ICD-10-CM | POA: Diagnosis not present

## 2016-07-22 DIAGNOSIS — I10 Essential (primary) hypertension: Secondary | ICD-10-CM | POA: Diagnosis not present

## 2016-07-22 DIAGNOSIS — I480 Paroxysmal atrial fibrillation: Secondary | ICD-10-CM | POA: Diagnosis not present

## 2016-07-22 DIAGNOSIS — I2510 Atherosclerotic heart disease of native coronary artery without angina pectoris: Secondary | ICD-10-CM | POA: Diagnosis not present

## 2016-07-22 DIAGNOSIS — E782 Mixed hyperlipidemia: Secondary | ICD-10-CM | POA: Diagnosis not present

## 2016-07-25 DIAGNOSIS — M1711 Unilateral primary osteoarthritis, right knee: Secondary | ICD-10-CM | POA: Diagnosis not present

## 2016-08-09 ENCOUNTER — Other Ambulatory Visit: Payer: Self-pay | Admitting: Unknown Physician Specialty

## 2016-08-18 DIAGNOSIS — F0280 Dementia in other diseases classified elsewhere without behavioral disturbance: Secondary | ICD-10-CM | POA: Diagnosis not present

## 2016-08-18 DIAGNOSIS — I10 Essential (primary) hypertension: Secondary | ICD-10-CM | POA: Diagnosis not present

## 2016-08-18 DIAGNOSIS — N959 Unspecified menopausal and perimenopausal disorder: Secondary | ICD-10-CM | POA: Diagnosis not present

## 2016-08-18 DIAGNOSIS — F0150 Vascular dementia without behavioral disturbance: Secondary | ICD-10-CM | POA: Diagnosis not present

## 2016-08-18 DIAGNOSIS — Z0000 Encounter for general adult medical examination without abnormal findings: Secondary | ICD-10-CM | POA: Diagnosis not present

## 2016-08-18 DIAGNOSIS — I480 Paroxysmal atrial fibrillation: Secondary | ICD-10-CM | POA: Diagnosis not present

## 2016-08-18 DIAGNOSIS — E039 Hypothyroidism, unspecified: Secondary | ICD-10-CM | POA: Diagnosis not present

## 2016-08-18 DIAGNOSIS — G309 Alzheimer's disease, unspecified: Secondary | ICD-10-CM | POA: Diagnosis not present

## 2016-08-18 DIAGNOSIS — I2510 Atherosclerotic heart disease of native coronary artery without angina pectoris: Secondary | ICD-10-CM | POA: Diagnosis not present

## 2016-08-18 DIAGNOSIS — E782 Mixed hyperlipidemia: Secondary | ICD-10-CM | POA: Diagnosis not present

## 2016-08-20 ENCOUNTER — Other Ambulatory Visit: Payer: Self-pay | Admitting: Pediatrics

## 2016-08-20 DIAGNOSIS — N959 Unspecified menopausal and perimenopausal disorder: Secondary | ICD-10-CM

## 2016-08-20 DIAGNOSIS — Z0000 Encounter for general adult medical examination without abnormal findings: Secondary | ICD-10-CM

## 2016-08-25 ENCOUNTER — Ambulatory Visit: Payer: Medicare Other | Admitting: Unknown Physician Specialty

## 2016-08-27 ENCOUNTER — Ambulatory Visit: Payer: Self-pay | Admitting: Unknown Physician Specialty

## 2016-08-27 ENCOUNTER — Emergency Department
Admission: EM | Admit: 2016-08-27 | Discharge: 2016-08-27 | Disposition: A | Discharge status: Home / Self Care | Payer: Medicare Other | Attending: Emergency Medicine | Admitting: Emergency Medicine

## 2016-08-27 DIAGNOSIS — N183 Chronic kidney disease, stage 3 (moderate): Secondary | ICD-10-CM | POA: Diagnosis not present

## 2016-08-27 DIAGNOSIS — R251 Tremor, unspecified: Secondary | ICD-10-CM

## 2016-08-27 DIAGNOSIS — I129 Hypertensive chronic kidney disease with stage 1 through stage 4 chronic kidney disease, or unspecified chronic kidney disease: Secondary | ICD-10-CM | POA: Insufficient documentation

## 2016-08-27 DIAGNOSIS — F419 Anxiety disorder, unspecified: Secondary | ICD-10-CM | POA: Insufficient documentation

## 2016-08-27 DIAGNOSIS — Z79899 Other long term (current) drug therapy: Secondary | ICD-10-CM | POA: Diagnosis not present

## 2016-08-27 DIAGNOSIS — I2510 Atherosclerotic heart disease of native coronary artery without angina pectoris: Secondary | ICD-10-CM | POA: Diagnosis not present

## 2016-08-27 DIAGNOSIS — Z7982 Long term (current) use of aspirin: Secondary | ICD-10-CM | POA: Diagnosis not present

## 2016-08-27 LAB — CBC WITH DIFFERENTIAL/PLATELET
BASOS ABS: 0.1 10*3/uL (ref 0–0.1)
Basophils Relative: 1 %
Eosinophils Absolute: 0.1 10*3/uL (ref 0–0.7)
Eosinophils Relative: 2 %
HEMATOCRIT: 43 % (ref 35.0–47.0)
Hemoglobin: 14.7 g/dL (ref 12.0–16.0)
LYMPHS PCT: 21 %
Lymphs Abs: 1.1 10*3/uL (ref 1.0–3.6)
MCH: 34.2 pg — ABNORMAL HIGH (ref 26.0–34.0)
MCHC: 34.2 g/dL (ref 32.0–36.0)
MCV: 99.8 fL (ref 80.0–100.0)
MONO ABS: 0.6 10*3/uL (ref 0.2–0.9)
Monocytes Relative: 12 %
NEUTROS ABS: 3.6 10*3/uL (ref 1.4–6.5)
Neutrophils Relative %: 64 %
Platelets: 175 10*3/uL (ref 150–440)
RBC: 4.31 MIL/uL (ref 3.80–5.20)
RDW: 13.9 % (ref 11.5–14.5)
WBC: 5.5 10*3/uL (ref 3.6–11.0)

## 2016-08-27 LAB — COMPREHENSIVE METABOLIC PANEL
ALK PHOS: 42 U/L (ref 38–126)
ALT: 18 U/L (ref 14–54)
AST: 27 U/L (ref 15–41)
Albumin: 3.8 g/dL (ref 3.5–5.0)
Anion gap: 6 (ref 5–15)
BILIRUBIN TOTAL: 0.8 mg/dL (ref 0.3–1.2)
BUN: 19 mg/dL (ref 6–20)
CO2: 29 mmol/L (ref 22–32)
CREATININE: 0.81 mg/dL (ref 0.44–1.00)
Calcium: 8.9 mg/dL (ref 8.9–10.3)
Chloride: 103 mmol/L (ref 101–111)
GFR calc Af Amer: >60 mL/min (ref >60)
Glucose, Bld: 99 mg/dL (ref 65–99)
Potassium: 3.7 mmol/L (ref 3.5–5.1)
Sodium: 138 mmol/L (ref 135–145)
TOTAL PROTEIN: 6.7 g/dL (ref 6.5–8.1)

## 2016-08-27 LAB — URINALYSIS, ROUTINE W REFLEX MICROSCOPIC
Bacteria, UA: NONE SEEN
Bilirubin Urine: NEGATIVE
Glucose, UA: NEGATIVE mg/dL
Ketones, ur: NEGATIVE mg/dL
Leukocytes, UA: NEGATIVE
Nitrite: NEGATIVE
PH: 7 (ref 5.0–8.0)
Protein, ur: NEGATIVE mg/dL
Specific Gravity, Urine: 1.008 (ref 1.005–1.030)

## 2016-08-27 LAB — LIPASE, BLOOD: LIPASE: 29 U/L (ref 11–51)

## 2016-08-27 NOTE — ED Notes (Signed)
EKG performed. Signed by Dr. Jimmye Norman

## 2016-08-27 NOTE — ED Triage Notes (Signed)
Pt c/o feeling shaky, weak since Saturday.. States she had one day of diarrhea.. States her medications were recently changes and thinks it may be related to that.

## 2016-08-27 NOTE — ED Notes (Signed)
Pt denies pain, reports that she has been feeling "shaky" for the past few days and it worsened it last night. Pt denies fever.

## 2016-08-27 NOTE — ED Triage Notes (Signed)
Pt presents to stat desk with c/o abd pain and states she has been shaking and does not feel good. NAD.

## 2016-08-27 NOTE — ED Provider Notes (Signed)
[facility] Emergency Department Provider Note  Time seen: 8:27 AM  I have reviewed the triage vital signs and the nursing notes.   HISTORY  Chief Complaint Abdominal Pain and Shaking    HPI Felicia Terry is a 83 y.o. female with a past medical history of hypertension, dementia, hyperlipidemia, presents the emergency department for shaking. According to the patient she states she was up shaking all night. Denies fever. Denies any pain. Patient is not sure why she was shaking. Daughter spoke privately to me outside of the room and states for the past one month patient has had worsening dementia, but daughter is planning to move in with the patient and her husband to continue to care for her mother. She states the patient is convinced that her medications have been changed and it causes her a great degree of anxiety and stress. Daughter states last night she was upset about her medications being changed, the daughter attempted to convince the patient that no one has changed her medications. Patient became very upset and was crying and shaking per daughter which continued at times overnight so the daughter brought her to the emergency department today for evaluation. Daughter states they have already been talking to the primary care doctor about this but they do not want to place her on any sedating medications at this time. Daughter states one of her blood pressure pills was discontinued one month ago by her cardiologist (amlodipine), but this is the only change. Here the patient appears well, has no specific complaints currently but states she feels "miserable" but cannot tell me why.  Past Medical History:  Diagnosis Date  . Coronary artery disease    cardiac stent  . Hypertension   . Reynolds syndrome [facility])     Patient Active Problem List   Diagnosis Date Noted  . At high risk for falls 05/19/2016  . Right knee pain 10/12/2015  . Allergic rhinitis 10/12/2015   . Weight loss 10/12/2015  . CAD (coronary artery disease) 10/12/2015  . Low back pain 10/12/2015  . Shoulder pain 10/12/2015  . CKD (chronic kidney disease) stage 3, GFR 30-59 ml/min 10/03/2015  . Anterolisthesis 09/15/2015  . Ectatic thoracic aorta ([place]) 08/21/2015  . Pulmonary nodule, left 08/21/2015  . Weakness 05/21/2015  . Dysphagia 05/21/2015  . Arteriosclerosis of coronary artery 05/15/2015  . Benign essential HTN 05/15/2015  . Hernia of pelvic floor 04/30/2015  . Breast cancer screening 04/30/2015  . Macrocytosis 04/12/2015  . Senile purpura ([place]) 04/12/2015  . Fatigue 04/09/2015  . Hematuria, microscopic 04/09/2015  . Dementia 04/09/2015  . Dyslipidemia 04/09/2015  . Medication monitoring encounter 04/09/2015  . Other specified hypothyroidism 04/09/2015  . Essential hypertension, benign 04/09/2015  . Breathlessness on exertion 10/31/2014  . TI (tricuspid incompetence) 10/31/2014  . MI (mitral incompetence) 10/31/2014  . AI (aortic incompetence) 10/31/2014  . Beat, premature ventricular 03/22/2014  . Paroxysmal atrial fibrillation ([place]) 03/02/2014    Past Surgical History:  Procedure Laterality Date  . ABDOMINAL HYSTERECTOMY      Prior to Admission medications   Medication Sig Start Date End Date Taking? Authorizing Provider  acetaminophen (TYLENOL ARTHRITIS PAIN) 650 MG CR tablet Take 650 mg by mouth every 8 (eight) hours as needed for pain.    Historical Provider, MD  aspirin EC 81 MG tablet Take 81 mg by mouth daily.    Historical Provider, MD  cetaphil (CETAPHIL) lotion Apply 1 application topically as needed for dry skin.  Historical Provider, MD  cromolyn (OPTICROM) 4 % ophthalmic solution Place 1 drop into the left eye 4 (four) times daily. Patient not taking: Reported on 05/19/2016 04/07/16   [name], NP  diltiazem (CARDIZEM) 30 MG tablet Take 1 tablet by mouth 2 (two) times daily. 05/16/15   Historical Provider, MD  donepezil (ARICEPT) 5 MG tablet  Take 1 tablet (5 mg total) by mouth at bedtime. 04/07/16   [name], NP  esomeprazole (NEXIUM) 40 MG capsule Take 40 mg by mouth daily.    Historical Provider, MD  ipratropium (ATROVENT) 0.03 % nasal spray Place 2 sprays into both nostrils every 12 (twelve) hours. Patient not taking: Reported on 05/19/2016 10/12/15   [name], NP  levothyroxine (SYNTHROID, LEVOTHROID) 25 MCG tablet Take 1 tablet (25 mcg total) by mouth daily. 03/28/16   [name], NP  lisinopril (PRINIVIL,ZESTRIL) 2.5 MG tablet Take 1 tablet (2.5 mg total) by mouth daily. 04/07/16   [name], NP  Multiple Vitamin (MULTI-VITAMINS) TABS Take by mouth daily.    Historical Provider, MD  polyethylene glycol (MIRALAX / GLYCOLAX) packet Take 17 g by mouth every other day.    Historical Provider, MD  propafenone (RYTHMOL SR) 225 MG 12 hr capsule Take 225 mg by mouth 2 (two) times daily.  01/26/15   Historical Provider, MD  triamcinolone cream (KENALOG) 0.1 % Apply 1 application topically 2 (two) times daily. Too strong for face Patient not taking: Reported on 05/19/2016 08/31/15   Arnetha Courser, MD  vitamin B-12 (CYANOCOBALAMIN) 1000 MCG tablet Take 1,000 mcg by mouth daily.    Historical Provider, MD    No Known Allergies  Family History  Problem Relation Age of Onset  . Diabetes Mother   . Diabetes Son   . Cancer Neg Hx   . Heart disease Neg Hx   . Stroke Neg Hx     Social History Social History  Substance Use Topics  . Smoking status: Never Smoker  . Smokeless tobacco: Never Used  . Alcohol use No    Review of Systems Constitutional: Negative for fever. Cardiovascular: Negative for chest pain. Respiratory: Negative for shortness of breath. Gastrointestinal: Negative for abdominal pain Neurological: Negative for headache 10-point ROS otherwise negative.  ____________________________________________   PHYSICAL EXAM:  VITAL SIGNS: ED Triage Vitals [08/27/16 0732]  Enc Vitals Group     BP (!)  170/73     Pulse Rate 77     Resp 18     Temp 97.7 F (36.5 C)     Temp Source Oral     SpO2 98 %     Weight 118 lb (53.5 kg)     Height 5\' 1"  (1.549 m)     Head Circumference      Peak Flow      Pain Score      Pain Loc      Pain Edu?      Excl. in [place]?     Constitutional: Alert. Well appearing and in no distress.Lying in bed comfortably. Eyes: Normal exam ENT   Head: Normocephalic and atraumatic.   Mouth/Throat: Mucous membranes are moist. Cardiovascular: Normal rate, regular rhythm.  Respiratory: Normal respiratory effort without tachypnea nor retractions. Breath sounds are clear  Gastrointestinal: Soft and nontender. No distention.   Musculoskeletal: Nontender with normal range of motion in all extremities.  Neurologic:  Normal speech and language. No gross focal neurologic deficits Skin:  Skin is warm, dry and intact.  Psychiatric: Mood and affect  are normal. No apparent anxiety at this time.  ____________________________________________    EKG  EKG reviewed and interpreted by myself shows normal sinus rhythm at 60 bpm, narrow QRS, normal axis, normal intervals, no ST changes.  ____________________________________________    INITIAL IMPRESSION / ASSESSMENT AND PLAN / ED COURSE  Pertinent labs & imaging results that were available during my care of the patient were reviewed by me and considered in my medical decision making (see chart for details).  Patient presents to the emergency department with shaking/anxiety overnight last night. Daughter states the patient states her to bring her to the emergency department so the daughter brought her. Here the patient has no specific complaints. In review of systems denies any chest pain or abdominal pain. Denies shortness of breath fever, cough congestion nausea vomiting or diarrhea. Patient states she is just shaking, not shaking currently. Patient is calm at this time. In speaking to the daughter privately the symptoms  are very suggestive of anxiety and worsening dementia. We will check labs to look for any acute abnormalities. Daughter has already stated they do not want to be placed on sedating medications at this time, but will continue to follow up with her primary care doctor regarding this. Daughter is planning on moving in with her mother shortly so she can care for her more closely.  Patient's workup is normal. Patient very reassured by her negative workup is asking to be discharged home so she can go eat breakfast.  ____________________________________________   FINAL CLINICAL IMPRESSION(S) / ED DIAGNOSES  Anxiety    Harvest Dark, MD 08/27/16 [phone]

## 2016-09-02 ENCOUNTER — Other Ambulatory Visit: Payer: Self-pay | Admitting: Unknown Physician Specialty

## 2016-09-04 DIAGNOSIS — F068 Other specified mental disorders due to known physiological condition: Secondary | ICD-10-CM | POA: Diagnosis not present

## 2016-09-04 DIAGNOSIS — R5383 Other fatigue: Secondary | ICD-10-CM | POA: Diagnosis not present

## 2016-09-04 DIAGNOSIS — G479 Sleep disorder, unspecified: Secondary | ICD-10-CM | POA: Diagnosis not present

## 2016-09-08 DIAGNOSIS — G309 Alzheimer's disease, unspecified: Secondary | ICD-10-CM | POA: Diagnosis not present

## 2016-09-08 DIAGNOSIS — F064 Anxiety disorder due to known physiological condition: Secondary | ICD-10-CM | POA: Diagnosis not present

## 2016-09-08 DIAGNOSIS — F0150 Vascular dementia without behavioral disturbance: Secondary | ICD-10-CM | POA: Diagnosis not present

## 2016-09-08 DIAGNOSIS — G4700 Insomnia, unspecified: Secondary | ICD-10-CM | POA: Diagnosis not present

## 2016-09-08 DIAGNOSIS — F0280 Dementia in other diseases classified elsewhere without behavioral disturbance: Secondary | ICD-10-CM | POA: Diagnosis not present

## 2016-09-17 ENCOUNTER — Other Ambulatory Visit: Payer: Self-pay | Admitting: Unknown Physician Specialty

## 2016-09-17 DIAGNOSIS — G301 Alzheimer's disease with late onset: Principal | ICD-10-CM

## 2016-09-17 DIAGNOSIS — F0280 Dementia in other diseases classified elsewhere without behavioral disturbance: Secondary | ICD-10-CM

## 2016-09-25 DIAGNOSIS — G309 Alzheimer's disease, unspecified: Secondary | ICD-10-CM | POA: Diagnosis not present

## 2016-09-25 DIAGNOSIS — G4700 Insomnia, unspecified: Secondary | ICD-10-CM | POA: Diagnosis not present

## 2016-09-25 DIAGNOSIS — F0280 Dementia in other diseases classified elsewhere without behavioral disturbance: Secondary | ICD-10-CM | POA: Diagnosis not present

## 2016-09-25 DIAGNOSIS — F064 Anxiety disorder due to known physiological condition: Secondary | ICD-10-CM | POA: Diagnosis not present

## 2016-09-25 DIAGNOSIS — F0150 Vascular dementia without behavioral disturbance: Secondary | ICD-10-CM | POA: Diagnosis not present

## 2016-10-27 ENCOUNTER — Ambulatory Visit: Payer: Medicare Other | Admitting: Family Medicine

## 2016-11-18 DIAGNOSIS — E782 Mixed hyperlipidemia: Secondary | ICD-10-CM | POA: Diagnosis not present

## 2016-11-18 DIAGNOSIS — I10 Essential (primary) hypertension: Secondary | ICD-10-CM | POA: Diagnosis not present

## 2016-11-18 DIAGNOSIS — I480 Paroxysmal atrial fibrillation: Secondary | ICD-10-CM | POA: Diagnosis not present

## 2016-11-18 DIAGNOSIS — I2510 Atherosclerotic heart disease of native coronary artery without angina pectoris: Secondary | ICD-10-CM | POA: Diagnosis not present

## 2016-11-25 DIAGNOSIS — I10 Essential (primary) hypertension: Secondary | ICD-10-CM | POA: Diagnosis not present

## 2016-11-25 DIAGNOSIS — F064 Anxiety disorder due to known physiological condition: Secondary | ICD-10-CM | POA: Diagnosis not present

## 2016-11-25 DIAGNOSIS — G4700 Insomnia, unspecified: Secondary | ICD-10-CM | POA: Diagnosis not present

## 2016-11-25 DIAGNOSIS — M25511 Pain in right shoulder: Secondary | ICD-10-CM | POA: Diagnosis not present

## 2016-11-25 DIAGNOSIS — G309 Alzheimer's disease, unspecified: Secondary | ICD-10-CM | POA: Diagnosis not present

## 2016-11-25 DIAGNOSIS — M25561 Pain in right knee: Secondary | ICD-10-CM | POA: Diagnosis not present

## 2016-11-25 DIAGNOSIS — F0150 Vascular dementia without behavioral disturbance: Secondary | ICD-10-CM | POA: Diagnosis not present

## 2016-11-25 DIAGNOSIS — F0280 Dementia in other diseases classified elsewhere without behavioral disturbance: Secondary | ICD-10-CM | POA: Diagnosis not present

## 2016-11-25 DIAGNOSIS — M25512 Pain in left shoulder: Secondary | ICD-10-CM | POA: Diagnosis not present

## 2016-12-25 DIAGNOSIS — M1711 Unilateral primary osteoarthritis, right knee: Secondary | ICD-10-CM | POA: Diagnosis not present

## 2017-01-15 DIAGNOSIS — H401130 Primary open-angle glaucoma, bilateral, stage unspecified: Secondary | ICD-10-CM | POA: Diagnosis not present

## 2017-01-15 DIAGNOSIS — H1859 Other hereditary corneal dystrophies: Secondary | ICD-10-CM | POA: Diagnosis not present

## 2017-01-22 ENCOUNTER — Telehealth: Payer: Self-pay | Admitting: Unknown Physician Specialty

## 2017-01-22 NOTE — Telephone Encounter (Signed)
Called pt to schedule for Annual Wellness Visit with Nurse Health Advisor, Tiffany Hill, my c/b # is [phone]  Kathryn Brown ° °

## 2017-01-24 ENCOUNTER — Encounter: Payer: Self-pay | Admitting: Emergency Medicine

## 2017-01-24 ENCOUNTER — Emergency Department
Admission: EM | Admit: 2017-01-24 | Discharge: 2017-01-24 | Disposition: A | Discharge status: Home / Self Care | Payer: Medicare Other | Attending: Emergency Medicine | Admitting: Emergency Medicine

## 2017-01-24 DIAGNOSIS — Z7982 Long term (current) use of aspirin: Secondary | ICD-10-CM | POA: Diagnosis not present

## 2017-01-24 DIAGNOSIS — Z79899 Other long term (current) drug therapy: Secondary | ICD-10-CM | POA: Insufficient documentation

## 2017-01-24 DIAGNOSIS — T50901A Poisoning by unspecified drugs, medicaments and biological substances, accidental (unintentional), initial encounter: Secondary | ICD-10-CM | POA: Diagnosis present

## 2017-01-24 DIAGNOSIS — I129 Hypertensive chronic kidney disease with stage 1 through stage 4 chronic kidney disease, or unspecified chronic kidney disease: Secondary | ICD-10-CM | POA: Diagnosis not present

## 2017-01-24 DIAGNOSIS — T39091A Poisoning by salicylates, accidental (unintentional), initial encounter: Secondary | ICD-10-CM | POA: Insufficient documentation

## 2017-01-24 DIAGNOSIS — I2510 Atherosclerotic heart disease of native coronary artery without angina pectoris: Secondary | ICD-10-CM | POA: Diagnosis not present

## 2017-01-24 DIAGNOSIS — N183 Chronic kidney disease, stage 3 (moderate): Secondary | ICD-10-CM | POA: Diagnosis not present

## 2017-01-24 DIAGNOSIS — T50991A Poisoning by other drugs, medicaments and biological substances, accidental (unintentional), initial encounter: Secondary | ICD-10-CM | POA: Diagnosis not present

## 2017-01-24 NOTE — ED Notes (Signed)
[place] for flex per dr Cherylann Banas

## 2017-01-24 NOTE — ED Triage Notes (Signed)
Pt ingested arthritis cream (trolamine salicylate) at lunch time per family.  Pt reports throat feels funny and did not want to eat dinner.

## 2017-01-24 NOTE — Discharge Instructions (Signed)
Continue to monitor throat irritation, if it does not completely resolve follow up with your PCP.   If you have any question about the ingestion of Aspercreme (trolamine salicylate) contact Poison Control [phone].

## 2017-01-24 NOTE — ED Provider Notes (Signed)
[facility] Emergency Department Provider Note   ____________________________________________   I have reviewed the triage vital signs and the nursing notes.   HISTORY  Chief Complaint Ingestion    HPI Felicia Terry is a 83 y.o. female presents to the emergency department after ingesting Aspercreme earlier this evening. Patient has of dementia and lives with her daughter and son-in-law. Patient's daughter reports the patient eating approximately half a teaspoon of Aspercreme. Patient reports throat irritation since the ingestion otherwise no other complaints. Patient denies fever, chills, headache, vision changes, chest pain, chest tightness, shortness of breath, abdominal pain, nausea and vomiting.  Past Medical History:  Diagnosis Date  . Coronary artery disease    cardiac stent  . Hypertension   . Reynolds syndrome [facility])     Patient Active Problem List   Diagnosis Date Noted  . At high risk for falls 05/19/2016  . Right knee pain 10/12/2015  . Allergic rhinitis 10/12/2015  . Weight loss 10/12/2015  . CAD (coronary artery disease) 10/12/2015  . Low back pain 10/12/2015  . Shoulder pain 10/12/2015  . CKD (chronic kidney disease) stage 3, GFR 30-59 ml/min 10/03/2015  . Anterolisthesis 09/15/2015  . Ectatic thoracic aorta ([place]) 08/21/2015  . Pulmonary nodule, left 08/21/2015  . Weakness 05/21/2015  . Dysphagia 05/21/2015  . Arteriosclerosis of coronary artery 05/15/2015  . Benign essential HTN 05/15/2015  . Hernia of pelvic floor 04/30/2015  . Breast cancer screening 04/30/2015  . Macrocytosis 04/12/2015  . Senile purpura ([place]) 04/12/2015  . Fatigue 04/09/2015  . Hematuria, microscopic 04/09/2015  . Dementia 04/09/2015  . Dyslipidemia 04/09/2015  . Medication monitoring encounter 04/09/2015  . Other specified hypothyroidism 04/09/2015  . Essential hypertension, benign 04/09/2015  . Breathlessness on exertion 10/31/2014  . TI  (tricuspid incompetence) 10/31/2014  . MI (mitral incompetence) 10/31/2014  . AI (aortic incompetence) 10/31/2014  . Beat, premature ventricular 03/22/2014  . Paroxysmal atrial fibrillation ([place]) 03/02/2014    Past Surgical History:  Procedure Laterality Date  . ABDOMINAL HYSTERECTOMY      Prior to Admission medications   Medication Sig Start Date End Date Taking? Authorizing Provider  acetaminophen (TYLENOL ARTHRITIS PAIN) 650 MG CR tablet Take 650 mg by mouth every 8 (eight) hours as needed for pain.    [provider]  aspirin EC 81 MG tablet Take 81 mg by mouth daily.    [provider]  cetaphil (CETAPHIL) lotion Apply 1 application topically as needed for dry skin.    [provider]  cromolyn (OPTICROM) 4 % ophthalmic solution Place 1 drop into the left eye 4 (four) times daily. Patient not taking: Reported on 05/19/2016 04/07/16   [name], NP  diltiazem (CARDIZEM) 30 MG tablet Take 1 tablet by mouth 2 (two) times daily. 05/16/15   [provider]  donepezil (ARICEPT) 5 MG tablet Take 1 tablet (5 mg total) by mouth at bedtime. 04/07/16   [name], NP  esomeprazole (NEXIUM) 40 MG capsule Take 40 mg by mouth daily.    [provider]  ipratropium (ATROVENT) 0.03 % nasal spray Place 2 sprays into both nostrils every 12 (twelve) hours. Patient not taking: Reported on 05/19/2016 10/12/15   [name], NP  levothyroxine (SYNTHROID, LEVOTHROID) 25 MCG tablet Take 1 tablet (25 mcg total) by mouth daily. 03/28/16   [name], NP  lisinopril (PRINIVIL,ZESTRIL) 2.5 MG tablet Take 1 tablet (2.5 mg total) by mouth daily. 04/07/16   [name], NP  Multiple Vitamin (  MULTI-VITAMINS) TABS Take by mouth daily.    [provider]  polyethylene glycol (MIRALAX / GLYCOLAX) packet Take 17 g by mouth every other day.    [provider]  pravastatin (PRAVACHOL) 40 MG tablet Take 40 mg by mouth at bedtime.    [provider]  triamcinolone cream (KENALOG) 0.1 % Apply 1 application topically 2 (two) times daily. Too strong for face Patient not taking: Reported on 05/19/2016 08/31/15   Arnetha Courser, MD    Allergies Patient has no known allergies.  Family History  Problem Relation Age of Onset  . Diabetes Mother   . Diabetes Son   . Cancer Neg Hx   . Heart disease Neg Hx   . Stroke Neg Hx     Social History Social History  Substance Use Topics  . Smoking status: Never Smoker  . Smokeless tobacco: Never Used  . Alcohol use No    Review of Systems Constitutional: Negative for fever/chills Eyes: No visual changes. ENT:  Negative for sore throat and for difficulty swallowing. Reports throat irritation.  Cardiovascular: Denies chest pain. Respiratory: Denies cough. Denies shortness of breath. Gastrointestinal: No abdominal pain.  No nausea, vomiting, diarrhea. Genitourinary: Negative for dysuria. Musculoskeletal: Negative for back pain. Skin: Negative for rash. Neurological: Negative for headaches.  Negative focal weakness or numbness. Negative for loss of consciousness. Able to ambulate. ____________________________________________   PHYSICAL EXAM:  VITAL SIGNS: ED Triage Vitals  Enc Vitals Group     BP 01/24/17 1848 137/90     Pulse Rate 01/24/17 1848 (!) 59     Resp 01/24/17 1848 16     Temp 01/24/17 1848 98.3 F (36.8 C)     Temp Source 01/24/17 1848 Oral     SpO2 01/24/17 1848 99 %     Weight 01/24/17 1849 117 lb (53.1 kg)     Height 01/24/17 1849 5\' 1"  (1.549 m)     Head Circumference --      Peak Flow --      Pain Score --      Pain Loc --      Pain Edu? --      Excl. in [place]? --     Constitutional: Alert and oriented. Well appearing and in no acute distress.  Eyes: Conjunctivae are normal. PERRL. EOMI  Head: Normocephalic and atraumatic. ENT:      Ears: Canals clear. TMs intact bilaterally.      Nose: No congestion/rhinnorhea.      Mouth/Throat: Mucous  membranes are moist. Oropharynx nonerythematous or edematous. Neck:Supple. No thyromegaly. No stridor.  Cardiovascular: Normal rate, regular rhythm. Normal S1 and S2.  Good peripheral circulation. Respiratory: Normal respiratory effort without tachypnea or retractions. Lungs CTAB. No wheezes/rales/rhonchi. Good air entry to the bases with no decreased or absent breath sounds. Hematological/Lymphatic/Immunological: No cervical lymphadenopathy. Cardiovascular: Normal rate, regular rhythm. Normal distal pulses. Gastrointestinal: Bowel sounds 4 quadrants. Soft and nontender to palpation. No guarding or rigidity. No palpable masses. No distention. No CVA tenderness. Musculoskeletal: Nontender with normal range of motion in all extremities. Neurologic: Normal speech and language. No gross focal neurologic deficits are appreciated. No gait instability. Cranial nerves: II-X intact. No sensory loss or abnormal reflexes.  Skin:  Skin is warm, dry and intact. No rash noted. Psychiatric: Mood and affect are normal. Speech and behavior are normal. Patient exhibits appropriate insight and judgement.  ____________________________________________   LABS (all labs ordered are listed, but only abnormal results are displayed)  Labs  Reviewed - No data to display ____________________________________________  EKG none ____________________________________________  RADIOLOGY none ____________________________________________   PROCEDURES  Procedure(s) performed: no    Critical Care performed: no ____________________________________________   INITIAL IMPRESSION / ASSESSMENT AND PLAN / ED COURSE  Pertinent labs & imaging results that were available during my care of the patient were reviewed by me and considered in my medical decision making (see chart for details).   Patient presents to emergency department following ingestion of Aspercreme earlier this evening. History, physical exam findings  and vital signs were reassuring no physical injury occurred from the ingestion. Poison control contacted: Stated not a significant amount to cause injury. Patient advised to follow up with PCP as needed or return to the emergency department if symptoms return or worsen.   ____________________________________________   FINAL CLINICAL IMPRESSION(S) / ED DIAGNOSES  Final diagnoses:  Accidental drug ingestion, initial encounter       NEW MEDICATIONS STARTED DURING THIS VISIT:  Discharge Medication List as of 01/24/2017  7:40 PM       Note:  This document was prepared using Dragon voice recognition software and may include unintentional dictation errors.   Alric Quan 01/24/17 2311    Lavonia Drafts, MD 01/24/17 2328

## 2017-02-17 DIAGNOSIS — G4700 Insomnia, unspecified: Secondary | ICD-10-CM | POA: Diagnosis not present

## 2017-02-17 DIAGNOSIS — F064 Anxiety disorder due to known physiological condition: Secondary | ICD-10-CM | POA: Diagnosis not present

## 2017-02-17 DIAGNOSIS — E782 Mixed hyperlipidemia: Secondary | ICD-10-CM | POA: Diagnosis not present

## 2017-02-17 DIAGNOSIS — L84 Corns and callosities: Secondary | ICD-10-CM | POA: Diagnosis not present

## 2017-02-17 DIAGNOSIS — F0280 Dementia in other diseases classified elsewhere without behavioral disturbance: Secondary | ICD-10-CM | POA: Diagnosis not present

## 2017-02-17 DIAGNOSIS — F0150 Vascular dementia without behavioral disturbance: Secondary | ICD-10-CM | POA: Diagnosis not present

## 2017-02-17 DIAGNOSIS — G309 Alzheimer's disease, unspecified: Secondary | ICD-10-CM | POA: Diagnosis not present

## 2017-02-26 DIAGNOSIS — L909 Atrophic disorder of skin, unspecified: Secondary | ICD-10-CM | POA: Diagnosis not present

## 2017-02-26 DIAGNOSIS — M79672 Pain in left foot: Secondary | ICD-10-CM | POA: Diagnosis not present

## 2017-02-26 DIAGNOSIS — B351 Tinea unguium: Secondary | ICD-10-CM | POA: Diagnosis not present

## 2017-03-09 DIAGNOSIS — M1711 Unilateral primary osteoarthritis, right knee: Secondary | ICD-10-CM | POA: Diagnosis not present

## 2017-03-10 ENCOUNTER — Encounter: Payer: Self-pay | Admitting: Emergency Medicine

## 2017-03-10 ENCOUNTER — Emergency Department
Admission: EM | Admit: 2017-03-10 | Discharge: 2017-03-10 | Disposition: A | Discharge status: Home / Self Care | Payer: Medicare Other | Attending: Emergency Medicine | Admitting: Emergency Medicine

## 2017-03-10 ENCOUNTER — Emergency Department: Payer: Medicare Other

## 2017-03-10 DIAGNOSIS — Z79899 Other long term (current) drug therapy: Secondary | ICD-10-CM | POA: Diagnosis not present

## 2017-03-10 DIAGNOSIS — F0280 Dementia in other diseases classified elsewhere without behavioral disturbance: Secondary | ICD-10-CM | POA: Diagnosis not present

## 2017-03-10 DIAGNOSIS — R079 Chest pain, unspecified: Secondary | ICD-10-CM | POA: Diagnosis not present

## 2017-03-10 DIAGNOSIS — G309 Alzheimer's disease, unspecified: Secondary | ICD-10-CM | POA: Insufficient documentation

## 2017-03-10 DIAGNOSIS — N183 Chronic kidney disease, stage 3 (moderate): Secondary | ICD-10-CM | POA: Diagnosis not present

## 2017-03-10 DIAGNOSIS — I2510 Atherosclerotic heart disease of native coronary artery without angina pectoris: Secondary | ICD-10-CM | POA: Insufficient documentation

## 2017-03-10 DIAGNOSIS — R0789 Other chest pain: Secondary | ICD-10-CM | POA: Diagnosis not present

## 2017-03-10 DIAGNOSIS — I129 Hypertensive chronic kidney disease with stage 1 through stage 4 chronic kidney disease, or unspecified chronic kidney disease: Secondary | ICD-10-CM | POA: Diagnosis not present

## 2017-03-10 DIAGNOSIS — K222 Esophageal obstruction: Secondary | ICD-10-CM | POA: Diagnosis not present

## 2017-03-10 DIAGNOSIS — Z7982 Long term (current) use of aspirin: Secondary | ICD-10-CM | POA: Diagnosis not present

## 2017-03-10 DIAGNOSIS — R9431 Abnormal electrocardiogram [ECG] [EKG]: Secondary | ICD-10-CM | POA: Diagnosis not present

## 2017-03-10 LAB — COMPREHENSIVE METABOLIC PANEL
ALK PHOS: 45 U/L (ref 38–126)
ALT: 22 U/L (ref 14–54)
ANION GAP: 9 (ref 5–15)
AST: 36 U/L (ref 15–41)
Albumin: 4.3 g/dL (ref 3.5–5.0)
BILIRUBIN TOTAL: 0.6 mg/dL (ref 0.3–1.2)
BUN: 24 mg/dL — ABNORMAL HIGH (ref 6–20)
CALCIUM: 9.8 mg/dL (ref 8.9–10.3)
CO2: 26 mmol/L (ref 22–32)
Chloride: 104 mmol/L (ref 101–111)
Creatinine, Ser: 0.87 mg/dL (ref 0.44–1.00)
GFR calc non Af Amer: 59 mL/min — ABNORMAL LOW (ref >60)
Glucose, Bld: 145 mg/dL — ABNORMAL HIGH (ref 65–99)
POTASSIUM: 3.6 mmol/L (ref 3.5–5.1)
Sodium: 139 mmol/L (ref 135–145)
TOTAL PROTEIN: 7.7 g/dL (ref 6.5–8.1)

## 2017-03-10 LAB — CBC
HEMATOCRIT: 42.2 % (ref 35.0–47.0)
Hemoglobin: 14.5 g/dL (ref 12.0–16.0)
MCH: 34.3 pg — AB (ref 26.0–34.0)
MCHC: 34.4 g/dL (ref 32.0–36.0)
MCV: 99.5 fL (ref 80.0–100.0)
Platelets: 174 10*3/uL (ref 150–440)
RBC: 4.24 MIL/uL (ref 3.80–5.20)
RDW: 13.9 % (ref 11.5–14.5)
WBC: 9.8 10*3/uL (ref 3.6–11.0)

## 2017-03-10 LAB — TROPONIN I

## 2017-03-10 MED ORDER — GI COCKTAIL ~~LOC~~
30.0000 mL | Freq: Once | ORAL | Status: AC
  Administered 2017-03-10: 30 mL via ORAL
  Filled 2017-03-10: qty 30

## 2017-03-10 NOTE — ED Triage Notes (Signed)
Pt ems from restaurant, choked on bacon. Pt now has low mid sternal chest pain. No airway issues noted, B breath sounds clear. Pt poor historian.

## 2017-03-10 NOTE — ED Provider Notes (Signed)
[facility] Emergency Department Provider Note   ____________________________________________    I have reviewed the triage vital signs and the nursing notes.   HISTORY  Chief Complaint Chest pain  History severely limited by Alzheimer's dementia   HPI Felicia Terry is a 83 y.o. female Who presents with reported chest pain. Reportedly patient was eating breakfast and choked on a piece of bacon but was able to cough it back up again. Afterwards she complained of chest discomfort. Currently she is not entirely sure why she was in the emergency department. Review of records and a search does have a history of coronary artery disease. She denies shortness of breath.   Past Medical History:  Diagnosis Date  . Coronary artery disease    cardiac stent  . Hypertension   . Reynolds syndrome [facility])     Patient Active Problem List   Diagnosis Date Noted  . At high risk for falls 05/19/2016  . Right knee pain 10/12/2015  . Allergic rhinitis 10/12/2015  . Weight loss 10/12/2015  . CAD (coronary artery disease) 10/12/2015  . Low back pain 10/12/2015  . Shoulder pain 10/12/2015  . CKD (chronic kidney disease) stage 3, GFR 30-59 ml/min (HCC) 10/03/2015  . Anterolisthesis 09/15/2015  . Ectatic thoracic aorta ([place]) 08/21/2015  . Pulmonary nodule, left 08/21/2015  . Weakness 05/21/2015  . Dysphagia 05/21/2015  . Arteriosclerosis of coronary artery 05/15/2015  . Benign essential HTN 05/15/2015  . Hernia of pelvic floor 04/30/2015  . Breast cancer screening 04/30/2015  . Macrocytosis 04/12/2015  . Senile purpura ([place]) 04/12/2015  . Fatigue 04/09/2015  . Hematuria, microscopic 04/09/2015  . Dementia 04/09/2015  . Dyslipidemia 04/09/2015  . Medication monitoring encounter 04/09/2015  . Other specified hypothyroidism 04/09/2015  . Essential hypertension, benign 04/09/2015  . Breathlessness on exertion 10/31/2014  . TI (tricuspid incompetence)  10/31/2014  . MI (mitral incompetence) 10/31/2014  . AI (aortic incompetence) 10/31/2014  . Beat, premature ventricular 03/22/2014  . Paroxysmal atrial fibrillation ([place]) 03/02/2014    Past Surgical History:  Procedure Laterality Date  . ABDOMINAL HYSTERECTOMY      Prior to Admission medications   Medication Sig Start Date End Date Taking? Authorizing Provider  aspirin EC 81 MG tablet Take 81 mg by mouth daily.   Yes [provider]  diltiazem (CARDIZEM) 30 MG tablet Take 1 tablet by mouth 2 (two) times daily. 05/16/15  Yes [provider]  esomeprazole (NEXIUM) 40 MG capsule Take 40 mg by mouth daily.   Yes [provider]  levothyroxine (SYNTHROID, LEVOTHROID) 25 MCG tablet Take 1 tablet (25 mcg total) by mouth daily. Patient taking differently: Take 25 mcg by mouth daily.  03/28/16  Yes [name], NP  lisinopril (PRINIVIL,ZESTRIL) 2.5 MG tablet Take 1 tablet (2.5 mg total) by mouth daily. 04/07/16  Yes [name], NP  Multiple Vitamin (MULTI-VITAMINS) TABS Take by mouth daily.   Yes [provider]  pravastatin (PRAVACHOL) 40 MG tablet Take 40 mg by mouth at bedtime.   Yes [provider]  propranolol (INNOPRAN XL) 120 MG 24 hr capsule Take 240 mg by mouth 2 (two) times daily.   Yes [provider]  acetaminophen (TYLENOL ARTHRITIS PAIN) 650 MG CR tablet Take 650 mg by mouth every 8 (eight) hours as needed for pain.    [provider]  cetaphil (CETAPHIL) lotion Apply 1 application topically as needed for dry skin.    [provider]  cromolyn (OPTICROM) 4 %  ophthalmic solution Place 1 drop into the left eye 4 (four) times daily. Patient not taking: Reported on 05/19/2016 04/07/16   [name], NP  donepezil (ARICEPT) 5 MG tablet Take 1 tablet (5 mg total) by mouth at bedtime. Patient not taking: Reported on 03/10/2017 04/07/16   [name], NP  ipratropium (ATROVENT) 0.03 % nasal spray Place 2  sprays into both nostrils every 12 (twelve) hours. Patient not taking: Reported on 05/19/2016 10/12/15   [name], NP  meloxicam (MOBIC) 7.5 MG tablet Take 7.5 mg by mouth daily. 03/09/17   [provider]  mirtazapine (REMERON) 7.5 MG tablet Take 1 tablet by mouth daily. 01/13/17   [provider]  neomycin-polymyxin b-dexamethasone (MAXITROL) 3.5-10000-0.1 SUSP Place 1 drop into both eyes as needed. 01/15/17   [provider]  polyethylene glycol (MIRALAX / GLYCOLAX) packet Take 17 g by mouth every other day.    [provider]  triamcinolone cream (KENALOG) 0.1 % Apply 1 application topically 2 (two) times daily. Too strong for face Patient not taking: Reported on 05/19/2016 08/31/15   Arnetha Courser, MD     Allergies Patient has no known allergies.  Family History  Problem Relation Age of Onset  . Diabetes Mother   . Diabetes Son   . Cancer Neg Hx   . Heart disease Neg Hx   . Stroke Neg Hx     Social History Social History  Substance Use Topics  . Smoking status: Never Smoker  . Smokeless tobacco: Never Used  . Alcohol use No    Level V caveat: Unable to obtain full Review of Systems due to severe dementia     ____________________________________________   PHYSICAL EXAM:  VITAL SIGNS: ED Triage Vitals  Enc Vitals Group     BP 03/10/17 1031 137/61     Pulse Rate 03/10/17 1031 (!) 54     Resp 03/10/17 1031 16     Temp 03/10/17 1034 98.4 F (36.9 C)     Temp Source 03/10/17 1034 Oral     SpO2 03/10/17 1031 99 %     Weight 03/10/17 1036 54 kg (119 lb)     Height 03/10/17 1036 1.575 m (5\' 2" )     Head Circumference --      Peak Flow --      Pain Score --      Pain Loc --      Pain Edu? --      Excl. in [place]? --     Constitutional: Alert.. No acute distress. Eyes: Conjunctivae are normal.   Nose: No congestion/rhinnorhea. Mouth/Throat: Mucous membranes are moist.    Cardiovascular: Normal rate, regular rhythm. Grossly  normal heart sounds.  Good peripheral circulation. Respiratory: Normal respiratory effort.  No retractions. Gastrointestinal: Soft and nontender. No distention.  No CVA tenderness. Genitourinary: deferred Musculoskeletal:  Warm and well perfused Neurologic:  Normal speech and language. No gross focal neurologic deficits are appreciated.  Skin:  Skin is warm, dry and intact. No rash noted.   ____________________________________________   LABS (all labs ordered are listed, but only abnormal results are displayed)  Labs Reviewed  CBC - Abnormal; Notable for the following:       Result Value   MCH 34.3 (*)    All other components within normal limits  COMPREHENSIVE METABOLIC PANEL - Abnormal; Notable for the following:    Glucose, Bld 145 (*)    BUN 24 (*)    GFR calc non Af Amer 59 (*)  All other components within normal limits  TROPONIN I   ____________________________________________  EKG  ED ECG REPORT I, Lavonia Drafts, the attending physician, personally viewed and interpreted this ECG.  Date: 03/10/2017  Rhythm: normal sinus rhythm QRS Axis: normal Intervals: normal ST/T Wave abnormalities: normal Narrative Interpretation: no evidence of acute ischemia  ____________________________________________  RADIOLOGY  Chest x-ray unremarkable ____________________________________________   PROCEDURES  Procedure(s) performed: No    Critical Care performed: No ____________________________________________   INITIAL IMPRESSION / ASSESSMENT AND PLAN / ED COURSE  Pertinent labs & imaging results that were available during my care of the patient were reviewed by me and considered in my medical decision making (see chart for details).  Differential diagnosis includes ACS, nonspecific chest pain, gastritis, esophagitis.  Patient well-appearing and in no acute distress. History severely limited by dementia Patient has no physical complaints at this time.lab work  chest x-ray and EKG are all quite reassuring. Patient observed in the department with no further complaint of any chest pain. Given this feel discharge is appropriate    ____________________________________________   FINAL CLINICAL IMPRESSION(S) / ED DIAGNOSES  Final diagnoses:  Chest pain, unspecified type      NEW MEDICATIONS STARTED DURING THIS VISIT:  Discharge Medication List as of 03/10/2017 12:10 PM       Note:  This document was prepared using Dragon voice recognition software and may include unintentional dictation errors.    Lavonia Drafts, MD 03/10/17 [phone]

## 2017-03-10 NOTE — ED Notes (Signed)
NAD noted at time of D/C. Pt taken to lobby via wheelchair by this RN. Pt and husband deny any comments/concerns at this time.

## 2017-03-19 DIAGNOSIS — R05 Cough: Secondary | ICD-10-CM | POA: Diagnosis not present

## 2017-03-19 DIAGNOSIS — I77810 Thoracic aortic ectasia: Secondary | ICD-10-CM | POA: Diagnosis not present

## 2017-03-19 DIAGNOSIS — F0150 Vascular dementia without behavioral disturbance: Secondary | ICD-10-CM | POA: Diagnosis not present

## 2017-03-19 DIAGNOSIS — I2510 Atherosclerotic heart disease of native coronary artery without angina pectoris: Secondary | ICD-10-CM | POA: Diagnosis not present

## 2017-03-19 DIAGNOSIS — G309 Alzheimer's disease, unspecified: Secondary | ICD-10-CM | POA: Diagnosis not present

## 2017-03-19 DIAGNOSIS — J209 Acute bronchitis, unspecified: Secondary | ICD-10-CM | POA: Diagnosis not present

## 2017-03-19 DIAGNOSIS — F0280 Dementia in other diseases classified elsewhere without behavioral disturbance: Secondary | ICD-10-CM | POA: Diagnosis not present

## 2017-04-16 DIAGNOSIS — M1711 Unilateral primary osteoarthritis, right knee: Secondary | ICD-10-CM | POA: Diagnosis not present

## 2017-04-24 DIAGNOSIS — R2681 Unsteadiness on feet: Secondary | ICD-10-CM | POA: Diagnosis not present

## 2017-04-24 DIAGNOSIS — M25561 Pain in right knee: Secondary | ICD-10-CM | POA: Diagnosis not present

## 2017-04-24 DIAGNOSIS — R6889 Other general symptoms and signs: Secondary | ICD-10-CM | POA: Diagnosis not present

## 2017-05-27 DIAGNOSIS — M1711 Unilateral primary osteoarthritis, right knee: Secondary | ICD-10-CM | POA: Diagnosis not present

## 2017-05-28 DIAGNOSIS — F0280 Dementia in other diseases classified elsewhere without behavioral disturbance: Secondary | ICD-10-CM | POA: Diagnosis not present

## 2017-05-28 DIAGNOSIS — Z23 Encounter for immunization: Secondary | ICD-10-CM | POA: Diagnosis not present

## 2017-05-28 DIAGNOSIS — F0150 Vascular dementia without behavioral disturbance: Secondary | ICD-10-CM | POA: Diagnosis not present

## 2017-05-28 DIAGNOSIS — G4700 Insomnia, unspecified: Secondary | ICD-10-CM | POA: Diagnosis not present

## 2017-05-28 DIAGNOSIS — I10 Essential (primary) hypertension: Secondary | ICD-10-CM | POA: Diagnosis not present

## 2017-05-28 DIAGNOSIS — M25561 Pain in right knee: Secondary | ICD-10-CM | POA: Diagnosis not present

## 2017-05-28 DIAGNOSIS — G309 Alzheimer's disease, unspecified: Secondary | ICD-10-CM | POA: Diagnosis not present

## 2017-05-28 DIAGNOSIS — E039 Hypothyroidism, unspecified: Secondary | ICD-10-CM | POA: Diagnosis not present

## 2017-08-27 DIAGNOSIS — R262 Difficulty in walking, not elsewhere classified: Secondary | ICD-10-CM | POA: Diagnosis not present

## 2017-08-27 DIAGNOSIS — I480 Paroxysmal atrial fibrillation: Secondary | ICD-10-CM | POA: Diagnosis not present

## 2017-08-27 DIAGNOSIS — E782 Mixed hyperlipidemia: Secondary | ICD-10-CM | POA: Diagnosis not present

## 2017-08-27 DIAGNOSIS — R4701 Aphasia: Secondary | ICD-10-CM | POA: Diagnosis not present

## 2017-08-27 DIAGNOSIS — F0280 Dementia in other diseases classified elsewhere without behavioral disturbance: Secondary | ICD-10-CM | POA: Diagnosis not present

## 2017-08-27 DIAGNOSIS — F0150 Vascular dementia without behavioral disturbance: Secondary | ICD-10-CM | POA: Diagnosis not present

## 2017-08-27 DIAGNOSIS — I77810 Thoracic aortic ectasia: Secondary | ICD-10-CM | POA: Diagnosis not present

## 2017-08-27 DIAGNOSIS — E039 Hypothyroidism, unspecified: Secondary | ICD-10-CM | POA: Diagnosis not present

## 2017-08-27 DIAGNOSIS — G309 Alzheimer's disease, unspecified: Secondary | ICD-10-CM | POA: Diagnosis not present

## 2017-08-27 DIAGNOSIS — I700 Atherosclerosis of aorta: Secondary | ICD-10-CM | POA: Diagnosis not present

## 2017-08-27 DIAGNOSIS — I10 Essential (primary) hypertension: Secondary | ICD-10-CM | POA: Diagnosis not present

## 2017-09-02 DIAGNOSIS — F0150 Vascular dementia without behavioral disturbance: Secondary | ICD-10-CM | POA: Diagnosis not present

## 2017-09-02 DIAGNOSIS — F0280 Dementia in other diseases classified elsewhere without behavioral disturbance: Secondary | ICD-10-CM | POA: Diagnosis not present

## 2017-09-02 DIAGNOSIS — N951 Menopausal and female climacteric states: Secondary | ICD-10-CM | POA: Diagnosis not present

## 2017-09-02 DIAGNOSIS — G309 Alzheimer's disease, unspecified: Secondary | ICD-10-CM | POA: Diagnosis not present

## 2017-10-05 DIAGNOSIS — I2510 Atherosclerotic heart disease of native coronary artery without angina pectoris: Secondary | ICD-10-CM | POA: Diagnosis not present

## 2017-10-05 DIAGNOSIS — E039 Hypothyroidism, unspecified: Secondary | ICD-10-CM | POA: Diagnosis not present

## 2017-10-05 DIAGNOSIS — R4701 Aphasia: Secondary | ICD-10-CM | POA: Diagnosis not present

## 2017-10-05 DIAGNOSIS — I480 Paroxysmal atrial fibrillation: Secondary | ICD-10-CM | POA: Diagnosis not present

## 2017-10-05 DIAGNOSIS — G309 Alzheimer's disease, unspecified: Secondary | ICD-10-CM | POA: Diagnosis not present

## 2017-10-05 DIAGNOSIS — I10 Essential (primary) hypertension: Secondary | ICD-10-CM | POA: Diagnosis not present

## 2017-10-05 DIAGNOSIS — F0280 Dementia in other diseases classified elsewhere without behavioral disturbance: Secondary | ICD-10-CM | POA: Diagnosis not present

## 2017-10-05 DIAGNOSIS — E785 Hyperlipidemia, unspecified: Secondary | ICD-10-CM | POA: Diagnosis not present

## 2017-10-07 DIAGNOSIS — G309 Alzheimer's disease, unspecified: Secondary | ICD-10-CM | POA: Diagnosis not present

## 2017-10-07 DIAGNOSIS — I480 Paroxysmal atrial fibrillation: Secondary | ICD-10-CM | POA: Diagnosis not present

## 2017-10-07 DIAGNOSIS — R4701 Aphasia: Secondary | ICD-10-CM | POA: Diagnosis not present

## 2017-10-07 DIAGNOSIS — I10 Essential (primary) hypertension: Secondary | ICD-10-CM | POA: Diagnosis not present

## 2017-10-07 DIAGNOSIS — I2510 Atherosclerotic heart disease of native coronary artery without angina pectoris: Secondary | ICD-10-CM | POA: Diagnosis not present

## 2017-10-07 DIAGNOSIS — F0280 Dementia in other diseases classified elsewhere without behavioral disturbance: Secondary | ICD-10-CM | POA: Diagnosis not present

## 2017-10-08 DIAGNOSIS — G309 Alzheimer's disease, unspecified: Secondary | ICD-10-CM | POA: Diagnosis not present

## 2017-10-08 DIAGNOSIS — I2510 Atherosclerotic heart disease of native coronary artery without angina pectoris: Secondary | ICD-10-CM | POA: Diagnosis not present

## 2017-10-08 DIAGNOSIS — F0280 Dementia in other diseases classified elsewhere without behavioral disturbance: Secondary | ICD-10-CM | POA: Diagnosis not present

## 2017-10-08 DIAGNOSIS — I10 Essential (primary) hypertension: Secondary | ICD-10-CM | POA: Diagnosis not present

## 2017-10-08 DIAGNOSIS — R4701 Aphasia: Secondary | ICD-10-CM | POA: Diagnosis not present

## 2017-10-08 DIAGNOSIS — I480 Paroxysmal atrial fibrillation: Secondary | ICD-10-CM | POA: Diagnosis not present

## 2017-10-12 DIAGNOSIS — I10 Essential (primary) hypertension: Secondary | ICD-10-CM | POA: Diagnosis not present

## 2017-10-12 DIAGNOSIS — I480 Paroxysmal atrial fibrillation: Secondary | ICD-10-CM | POA: Diagnosis not present

## 2017-10-12 DIAGNOSIS — I2510 Atherosclerotic heart disease of native coronary artery without angina pectoris: Secondary | ICD-10-CM | POA: Diagnosis not present

## 2017-10-12 DIAGNOSIS — G309 Alzheimer's disease, unspecified: Secondary | ICD-10-CM | POA: Diagnosis not present

## 2017-10-12 DIAGNOSIS — F0280 Dementia in other diseases classified elsewhere without behavioral disturbance: Secondary | ICD-10-CM | POA: Diagnosis not present

## 2017-10-12 DIAGNOSIS — R4701 Aphasia: Secondary | ICD-10-CM | POA: Diagnosis not present

## 2017-10-14 DIAGNOSIS — G309 Alzheimer's disease, unspecified: Secondary | ICD-10-CM | POA: Diagnosis not present

## 2017-10-14 DIAGNOSIS — I480 Paroxysmal atrial fibrillation: Secondary | ICD-10-CM | POA: Diagnosis not present

## 2017-10-14 DIAGNOSIS — I2510 Atherosclerotic heart disease of native coronary artery without angina pectoris: Secondary | ICD-10-CM | POA: Diagnosis not present

## 2017-10-14 DIAGNOSIS — F0280 Dementia in other diseases classified elsewhere without behavioral disturbance: Secondary | ICD-10-CM | POA: Diagnosis not present

## 2017-10-14 DIAGNOSIS — R4701 Aphasia: Secondary | ICD-10-CM | POA: Diagnosis not present

## 2017-10-14 DIAGNOSIS — I10 Essential (primary) hypertension: Secondary | ICD-10-CM | POA: Diagnosis not present

## 2017-10-16 DIAGNOSIS — R4701 Aphasia: Secondary | ICD-10-CM | POA: Diagnosis not present

## 2017-10-16 DIAGNOSIS — G309 Alzheimer's disease, unspecified: Secondary | ICD-10-CM | POA: Diagnosis not present

## 2017-10-16 DIAGNOSIS — I10 Essential (primary) hypertension: Secondary | ICD-10-CM | POA: Diagnosis not present

## 2017-10-16 DIAGNOSIS — I480 Paroxysmal atrial fibrillation: Secondary | ICD-10-CM | POA: Diagnosis not present

## 2017-10-16 DIAGNOSIS — F0280 Dementia in other diseases classified elsewhere without behavioral disturbance: Secondary | ICD-10-CM | POA: Diagnosis not present

## 2017-10-16 DIAGNOSIS — I2510 Atherosclerotic heart disease of native coronary artery without angina pectoris: Secondary | ICD-10-CM | POA: Diagnosis not present

## 2017-11-20 DIAGNOSIS — M1711 Unilateral primary osteoarthritis, right knee: Secondary | ICD-10-CM | POA: Diagnosis not present

## 2017-11-26 DIAGNOSIS — F064 Anxiety disorder due to known physiological condition: Secondary | ICD-10-CM | POA: Diagnosis not present

## 2017-11-26 DIAGNOSIS — Z780 Asymptomatic menopausal state: Secondary | ICD-10-CM | POA: Diagnosis not present

## 2017-11-26 DIAGNOSIS — E782 Mixed hyperlipidemia: Secondary | ICD-10-CM | POA: Diagnosis not present

## 2017-11-26 DIAGNOSIS — Z23 Encounter for immunization: Secondary | ICD-10-CM | POA: Diagnosis not present

## 2017-11-26 DIAGNOSIS — G4700 Insomnia, unspecified: Secondary | ICD-10-CM | POA: Diagnosis not present

## 2017-11-26 DIAGNOSIS — E039 Hypothyroidism, unspecified: Secondary | ICD-10-CM | POA: Diagnosis not present

## 2017-11-26 DIAGNOSIS — G309 Alzheimer's disease, unspecified: Secondary | ICD-10-CM | POA: Diagnosis not present

## 2017-11-26 DIAGNOSIS — F0150 Vascular dementia without behavioral disturbance: Secondary | ICD-10-CM | POA: Diagnosis not present

## 2017-11-26 DIAGNOSIS — I10 Essential (primary) hypertension: Secondary | ICD-10-CM | POA: Diagnosis not present

## 2017-11-26 DIAGNOSIS — Z0000 Encounter for general adult medical examination without abnormal findings: Secondary | ICD-10-CM | POA: Diagnosis not present

## 2017-11-26 DIAGNOSIS — R4701 Aphasia: Secondary | ICD-10-CM | POA: Diagnosis not present

## 2017-11-26 DIAGNOSIS — I480 Paroxysmal atrial fibrillation: Secondary | ICD-10-CM | POA: Diagnosis not present

## 2017-11-26 DIAGNOSIS — F0280 Dementia in other diseases classified elsewhere without behavioral disturbance: Secondary | ICD-10-CM | POA: Diagnosis not present

## 2017-12-16 ENCOUNTER — Other Ambulatory Visit: Payer: Self-pay | Admitting: Pediatrics

## 2017-12-16 DIAGNOSIS — Z780 Asymptomatic menopausal state: Secondary | ICD-10-CM

## 2018-01-07 ENCOUNTER — Other Ambulatory Visit: Payer: Self-pay

## 2018-02-09 ENCOUNTER — Emergency Department
Admission: EM | Admit: 2018-02-09 | Discharge: 2018-02-09 | Disposition: A | Discharge status: Home / Self Care | Payer: Medicare Other | Attending: Emergency Medicine | Admitting: Emergency Medicine

## 2018-02-09 ENCOUNTER — Emergency Department: Payer: Medicare Other

## 2018-02-09 ENCOUNTER — Encounter: Payer: Self-pay | Admitting: Emergency Medicine

## 2018-02-09 DIAGNOSIS — S59911A Unspecified injury of right forearm, initial encounter: Secondary | ICD-10-CM | POA: Diagnosis not present

## 2018-02-09 DIAGNOSIS — I2510 Atherosclerotic heart disease of native coronary artery without angina pectoris: Secondary | ICD-10-CM | POA: Insufficient documentation

## 2018-02-09 DIAGNOSIS — W010XXA Fall on same level from slipping, tripping and stumbling without subsequent striking against object, initial encounter: Secondary | ICD-10-CM | POA: Insufficient documentation

## 2018-02-09 DIAGNOSIS — S43401A Unspecified sprain of right shoulder joint, initial encounter: Secondary | ICD-10-CM | POA: Diagnosis not present

## 2018-02-09 DIAGNOSIS — Z79899 Other long term (current) drug therapy: Secondary | ICD-10-CM | POA: Diagnosis not present

## 2018-02-09 DIAGNOSIS — I129 Hypertensive chronic kidney disease with stage 1 through stage 4 chronic kidney disease, or unspecified chronic kidney disease: Secondary | ICD-10-CM | POA: Insufficient documentation

## 2018-02-09 DIAGNOSIS — S4991XA Unspecified injury of right shoulder and upper arm, initial encounter: Secondary | ICD-10-CM | POA: Diagnosis not present

## 2018-02-09 DIAGNOSIS — Z7982 Long term (current) use of aspirin: Secondary | ICD-10-CM | POA: Diagnosis not present

## 2018-02-09 DIAGNOSIS — N183 Chronic kidney disease, stage 3 (moderate): Secondary | ICD-10-CM | POA: Insufficient documentation

## 2018-02-09 DIAGNOSIS — I480 Paroxysmal atrial fibrillation: Secondary | ICD-10-CM | POA: Insufficient documentation

## 2018-02-09 DIAGNOSIS — Y999 Unspecified external cause status: Secondary | ICD-10-CM | POA: Insufficient documentation

## 2018-02-09 DIAGNOSIS — Y939 Activity, unspecified: Secondary | ICD-10-CM | POA: Diagnosis not present

## 2018-02-09 DIAGNOSIS — S299XXA Unspecified injury of thorax, initial encounter: Secondary | ICD-10-CM | POA: Diagnosis not present

## 2018-02-09 DIAGNOSIS — S8001XA Contusion of right knee, initial encounter: Secondary | ICD-10-CM | POA: Insufficient documentation

## 2018-02-09 DIAGNOSIS — M79631 Pain in right forearm: Secondary | ICD-10-CM | POA: Diagnosis not present

## 2018-02-09 DIAGNOSIS — Y929 Unspecified place or not applicable: Secondary | ICD-10-CM | POA: Insufficient documentation

## 2018-02-09 DIAGNOSIS — R0781 Pleurodynia: Secondary | ICD-10-CM | POA: Diagnosis not present

## 2018-02-09 DIAGNOSIS — S8991XA Unspecified injury of right lower leg, initial encounter: Secondary | ICD-10-CM | POA: Diagnosis not present

## 2018-02-09 DIAGNOSIS — M25561 Pain in right knee: Secondary | ICD-10-CM | POA: Diagnosis not present

## 2018-02-09 DIAGNOSIS — S40011A Contusion of right shoulder, initial encounter: Secondary | ICD-10-CM | POA: Diagnosis not present

## 2018-02-09 DIAGNOSIS — W19XXXA Unspecified fall, initial encounter: Secondary | ICD-10-CM

## 2018-02-09 DIAGNOSIS — M25511 Pain in right shoulder: Secondary | ICD-10-CM | POA: Diagnosis not present

## 2018-02-09 NOTE — ED Triage Notes (Signed)
MD office called, reports X-ray shows a fracture. Also concerned because they do not know why she fell out and a EKG was never completed.

## 2018-02-09 NOTE — ED Notes (Addendum)
Pt has already been to and from XR. Upon arrival into ED room pt alert, ABCs intact, NAD. Family at bedside with pt. Pt and son aware we are awaiting XR results and deny current needs/complaints. Pt provided with extra warm blanket. Call bell within reach

## 2018-02-09 NOTE — ED Triage Notes (Signed)
Patient fell yesterday.  Per son fall was caused by patient losing her balance and fall was witnessed by patient's husband.  Patient denies losing consciousness and denies hitting her head.  Patient is complaining of right shoulder pain, right clavicular pain, right forearm pain, and right knee pain.

## 2018-02-09 NOTE — ED Provider Notes (Signed)
[facility] Emergency Department Provider Note   ____________________________________________    I have reviewed the triage vital signs and the nursing notes.   HISTORY  Chief Complaint Fall     HPI Felicia Terry is a 84 y.o. female who presents after a fall.  Reportedly the patient lost her balance yesterday and fell onto her right side.  She complains of pain in her right shoulder, right knee primarily.  Was sent to the emergency department to be evaluated for possible fractures from urgent care.  She denies head injury.  No neck pain.  She states she lost her balance, no dizziness or palpitations.  Past Medical History:  Diagnosis Date  . Coronary artery disease    cardiac stent  . Hypertension   . Reynolds syndrome [facility])     Patient Active Problem List   Diagnosis Date Noted  . At high risk for falls 05/19/2016  . Right knee pain 10/12/2015  . Allergic rhinitis 10/12/2015  . Weight loss 10/12/2015  . CAD (coronary artery disease) 10/12/2015  . Low back pain 10/12/2015  . Shoulder pain 10/12/2015  . CKD (chronic kidney disease) stage 3, GFR 30-59 ml/min (HCC) 10/03/2015  . Anterolisthesis 09/15/2015  . Ectatic thoracic aorta ([place]) 08/21/2015  . Pulmonary nodule, left 08/21/2015  . Weakness 05/21/2015  . Dysphagia 05/21/2015  . Arteriosclerosis of coronary artery 05/15/2015  . Benign essential HTN 05/15/2015  . Hernia of pelvic floor 04/30/2015  . Breast cancer screening 04/30/2015  . Macrocytosis 04/12/2015  . Senile purpura ([place]) 04/12/2015  . Fatigue 04/09/2015  . Hematuria, microscopic 04/09/2015  . Dementia 04/09/2015  . Dyslipidemia 04/09/2015  . Medication monitoring encounter 04/09/2015  . Other specified hypothyroidism 04/09/2015  . Essential hypertension, benign 04/09/2015  . Breathlessness on exertion 10/31/2014  . TI (tricuspid incompetence) 10/31/2014  . MI (mitral incompetence) 10/31/2014  . AI (aortic  incompetence) 10/31/2014  . Beat, premature ventricular 03/22/2014  . Paroxysmal atrial fibrillation ([place]) 03/02/2014    Past Surgical History:  Procedure Laterality Date  . ABDOMINAL HYSTERECTOMY      Prior to Admission medications   Medication Sig Start Date End Date Taking? Authorizing Provider  acetaminophen (TYLENOL ARTHRITIS PAIN) 650 MG CR tablet Take 650 mg by mouth every 8 (eight) hours as needed for pain.    [provider]  aspirin EC 81 MG tablet Take 81 mg by mouth daily.    [provider]  cetaphil (CETAPHIL) lotion Apply 1 application topically as needed for dry skin.    [provider]  cromolyn (OPTICROM) 4 % ophthalmic solution Place 1 drop into the left eye 4 (four) times daily. Patient not taking: Reported on 05/19/2016 04/07/16   [name], NP  diltiazem (CARDIZEM) 30 MG tablet Take 1 tablet by mouth 2 (two) times daily. 05/16/15   [provider]  donepezil (ARICEPT) 5 MG tablet Take 1 tablet (5 mg total) by mouth at bedtime. Patient not taking: Reported on 03/10/2017 04/07/16   [name], NP  esomeprazole (NEXIUM) 40 MG capsule Take 40 mg by mouth daily.    [provider]  ipratropium (ATROVENT) 0.03 % nasal spray Place 2 sprays into both nostrils every 12 (twelve) hours. Patient not taking: Reported on 05/19/2016 10/12/15   [name], NP  levothyroxine (SYNTHROID, LEVOTHROID) 25 MCG tablet Take 1 tablet (25 mcg total) by mouth daily. Patient taking differently: Take 25 mcg by mouth daily.  03/28/16   [name], NP  lisinopril (PRINIVIL,ZESTRIL) 2.5 MG tablet Take 1 tablet (2.5 mg total) by mouth daily. 04/07/16   [name], NP  meloxicam (MOBIC) 7.5 MG tablet Take 7.5 mg by mouth daily. 03/09/17   [provider]  mirtazapine (REMERON) 7.5 MG tablet Take 1 tablet by mouth daily. 01/13/17   [provider]  Multiple Vitamin (MULTI-VITAMINS) TABS Take by mouth daily.    [provider]  neomycin-polymyxin b-dexamethasone (MAXITROL) 3.5-10000-0.1 SUSP Place 1 drop into both eyes as needed. 01/15/17   [provider]  polyethylene glycol (MIRALAX / GLYCOLAX) packet Take 17 g by mouth every other day.    [provider]  pravastatin (PRAVACHOL) 40 MG tablet Take 40 mg by mouth at bedtime.    [provider]  propranolol (INNOPRAN XL) 120 MG 24 hr capsule Take 240 mg by mouth 2 (two) times daily.    [provider]  triamcinolone cream (KENALOG) 0.1 % Apply 1 application topically 2 (two) times daily. Too strong for face Patient not taking: Reported on 05/19/2016 08/31/15   Arnetha Courser, MD     Allergies Patient has no known allergies.  Family History  Problem Relation Age of Onset  . Diabetes Mother   . Diabetes Son   . Cancer Neg Hx   . Heart disease Neg Hx   . Stroke Neg Hx     Social History Social History   Tobacco Use  . Smoking status: Never Smoker  . Smokeless tobacco: Never Used  Substance Use Topics  . Alcohol use: No  . Drug use: No    Review of Systems  Constitutional: No dizziness Eyes: No visual changes.  ENT: No neck pain Cardiovascular: Mild right upper chest discomfort Respiratory: Denies shortness of breath. Gastrointestinal: No abdominal pain.  No nausea, no vomiting.   Genitourinary: Negative for dysuria. Musculoskeletal: No back pain, right knee pain Skin: Negative for rash. Neurological: Negative for headaches   ____________________________________________   PHYSICAL EXAM:  VITAL SIGNS: ED Triage Vitals  Enc Vitals Group     BP 02/09/18 1125 (!) 166/133     Pulse Rate 02/09/18 1125 66     Resp 02/09/18 1125 20     Temp 02/09/18 1125 98.5 F (36.9 C)     Temp Source 02/09/18 1125 Oral     SpO2 02/09/18 1125 99 %     Weight 02/09/18 1128 54.4 kg (120 lb)     Height 02/09/18 1128 1.575 m (5\' 2" )     Head Circumference --      Peak Flow --      Pain Score 02/09/18 1128  6     Pain Loc --      Pain Edu? --      Excl. in [place]? --     Constitutional: Alert  Eyes: Conjunctivae are normal.   Nose: No congestion/rhinnorhea. Mouth/Throat: Mucous membranes are moist.   Neck:  Painless ROM, no vertebral tenderness palpation Cardiovascular: Normal rate, regular rhythm. Grossly normal heart sounds.  Good peripheral circulation.  No bony normalities of the chest wall Respiratory: Normal respiratory effort.  No retractions. Lungs CTAB. Gastrointestinal: Soft and nontender. No distention.    Musculoskeletal: Full range of motion of all extremities, some pain with extension and flexion of the right knee.  No pain with axial load on both hips.  Normal range of motion of both upper extremities, mild tenderness to palpation along the anterior right shoulder Neurologic:  Normal speech and language. No gross focal  neurologic deficits are appreciated.  Skin:  Skin is warm, dry Psychiatric: Mood and affect are normal. Speech and behavior are normal.  ____________________________________________   LABS (all labs ordered are listed, but only abnormal results are displayed)  Labs Reviewed - No data to display ____________________________________________  EKG  None ____________________________________________  RADIOLOGY  X-rays of the shoulder the knee, forearm and chest negative for fracture ____________________________________________   PROCEDURES  Procedure(s) performed: No  Procedures   Critical Care performed: No ____________________________________________   INITIAL IMPRESSION / ASSESSMENT AND PLAN / ED COURSE  Pertinent labs & imaging results that were available during my care of the patient were reviewed by me and considered in my medical decision making (see chart for details).  Patient overall well-appearing, x-rays negative for acute fractures, recommend supportive care, follow-up with PCP.     ____________________________________________   FINAL CLINICAL IMPRESSION(S) / ED DIAGNOSES  Final diagnoses:  Fall, initial encounter  Contusion of right knee, initial encounter  Sprain of right shoulder, unspecified shoulder sprain type, initial encounter        Note:  This document was prepared using Dragon voice recognition software and may include unintentional dictation errors.    Lavonia Drafts, MD 02/09/18 1525

## 2018-02-09 NOTE — ED Notes (Signed)
Kinner MD at bedside with update

## 2018-03-08 DIAGNOSIS — R1310 Dysphagia, unspecified: Secondary | ICD-10-CM | POA: Diagnosis not present

## 2018-03-08 DIAGNOSIS — Z23 Encounter for immunization: Secondary | ICD-10-CM | POA: Diagnosis not present

## 2018-03-16 DIAGNOSIS — G309 Alzheimer's disease, unspecified: Secondary | ICD-10-CM | POA: Diagnosis not present

## 2018-03-16 DIAGNOSIS — R1314 Dysphagia, pharyngoesophageal phase: Secondary | ICD-10-CM | POA: Diagnosis not present

## 2018-03-16 DIAGNOSIS — F0150 Vascular dementia without behavioral disturbance: Secondary | ICD-10-CM | POA: Diagnosis not present

## 2018-03-16 DIAGNOSIS — R142 Eructation: Secondary | ICD-10-CM | POA: Diagnosis not present

## 2018-03-16 DIAGNOSIS — R4701 Aphasia: Secondary | ICD-10-CM | POA: Diagnosis not present

## 2018-03-16 DIAGNOSIS — F0280 Dementia in other diseases classified elsewhere without behavioral disturbance: Secondary | ICD-10-CM | POA: Diagnosis not present

## 2018-03-16 DIAGNOSIS — I480 Paroxysmal atrial fibrillation: Secondary | ICD-10-CM | POA: Diagnosis not present

## 2018-03-17 ENCOUNTER — Other Ambulatory Visit: Payer: Self-pay | Admitting: Internal Medicine

## 2018-03-17 DIAGNOSIS — R1314 Dysphagia, pharyngoesophageal phase: Secondary | ICD-10-CM

## 2018-03-25 ENCOUNTER — Ambulatory Visit
Admission: RE | Admit: 2018-03-25 | Discharge: 2018-03-25 | Disposition: A | Discharge status: Home / Self Care | Payer: Medicare Other | Source: Ambulatory Visit | Attending: Internal Medicine | Admitting: Internal Medicine

## 2018-03-25 DIAGNOSIS — R1310 Dysphagia, unspecified: Secondary | ICD-10-CM | POA: Diagnosis not present

## 2018-03-25 DIAGNOSIS — K219 Gastro-esophageal reflux disease without esophagitis: Secondary | ICD-10-CM | POA: Diagnosis not present

## 2018-03-25 DIAGNOSIS — R1314 Dysphagia, pharyngoesophageal phase: Secondary | ICD-10-CM | POA: Diagnosis not present

## 2018-03-25 NOTE — Therapy (Addendum)
[place], [place], [address] Phone: [phone]   Fax:     Modified Barium Swallow  Patient Details  Name: Felicia Terry MRN: [id number] Date of Birth: 05/09/1930 No data recorded  Encounter Date: 03/25/2018  End of Session - 03/25/18 1620    Visit Number  1    Number of Visits  1    Date for SLP Re-Evaluation  03/25/18    SLP Start Time  53    SLP Stop Time   1400    SLP Time Calculation (min)  60 min    Activity Tolerance  Patient tolerated treatment well       Past Medical History:  Diagnosis Date  . Coronary artery disease    cardiac stent  . Hypertension   . Reynolds syndrome [facility])     Past Surgical History:  Procedure Laterality Date  . ABDOMINAL HYSTERECTOMY      There were no vitals filed for this visit.       Subjective: Patient behavior: (alertness, ability to follow instructions, etc.): Pt was seen by her PCP and GI ~2-3 weeks ago. It has been describd in MD notes and Son's report that patient has a sensation of food stoppage around the base of the throat but no vomiting. Per PCP's note, ps has trouble swallowing solids and her pills. She is able to drink water without difficulty. She is very distressed by the fact that the food seems to get stuck in the middle of her chest. She denies having the symptoms before; however upon chart review, it looks like she has at least called in with these symptoms previously. She is currently prescribed Nexium, but it is unclear if she is taking it. She manages her own medications and is not sure if she takes Nexium. She notes some weight loss. Pt has a baseline of Alzheimer's Dementia, GERD (both per MD notes) and other multiple medical issues; no PMH of stroke or recent pneumonia. Pt is alert and able to follow basic commands; Dentition. OM exam WFL. Of note, is pt's continuous Belching w/out po's. Pt stated this Belching behavior is  constant especially during meals.  Chief complaint: dysphagia   Objective:  Radiological Procedure: A videoflouroscopic evaluation of oral-preparatory, reflex initiation, and pharyngeal phases of the swallow was performed; as well as a screening of the upper esophageal phase.  I. POSTURE: upright II. VIEW: lateral III. COMPENSATORY STRATEGIES: time between bites/sips for Esophageal clearing IV. BOLUSES ADMINISTERED:  Thin Liquid: 5-6 sips via Cup   Nectar-thick Liquid: 1 trial sip  Honey-thick Liquid: NT  Puree: 3 trials  Mechanical Soft: 2 trials  Barium Tablet: 1 tablet in applesauce V. RESULTS OF EVALUATION: A. ORAL PREPARATORY PHASE: (The lips, tongue, and velum are observed for strength and coordination)       **Overall Severity Rating: WFL. Noted a timely oral phase w/ appropriate bolus management during mastication, appropriate bolus cohesion, and timely A-P transfer. Oral clearing was achieved b/t trials. Intermittent bolus piecemealing occurred w/ an involuntary, f/u swallow to finish clearing any residue noted.  B. SWALLOW INITIATION/REFLEX: (The reflex is normal if "triggered" by the time the bolus reached the base of the tongue)  **Overall Severity Rating: [facility]. Noted a timely pharyngeal swallow initiation; pharyngeal swallowing appeared to trigger at the level of the BOT-valleculae. Full airway closure noted during the swallowing. No laryngeal penetration or aspiration noted.  C. PHARYNGEAL PHASE: (Pharyngeal function is normal if the  bolus shows rapid, smooth, and continuous transit through the pharynx and there is no pharyngeal residue after the swallow)  **Overall Severity Rating: [facility]. Pt adequately cleared the pharynx of bolus residue post swallowing indicating adequate pharyngeal pressure and laryngeal excursion during the swallowing.   D. LARYNGEAL PENETRATION: (Material entering into the laryngeal inlet/vestibule but not aspirated): NONE  E. ASPIRATION:  NONE F. ESOPHAGEAL PHASE: (Screening of the upper esophagus): Esophageal dysmotility was noted in the Distal Esophagus w/ moderately slow (appearing) clearing even w/ Liquids. This Esophageal dysmotility occurred during all po trials and during the presentation of the Barium tablet (given in puree) which remained in the mid-lower Cervical Esophagus for an extended period of time b/f moving more distally.  ASSESSMENT: Pt appears to present w/ adequate oropharyngeal phase swallowing function w/ no oropharyngeal dysphagia noted during this exam. She is at reduced risk for aspiration from prandial aspiration, or an oropharyngeal phase standpoint. During the Oral phase, pt demonstrated a timely oral phase w/ appropriate bolus management during mastication, appropriate bolus cohesion, and timely A-P transfer. Oral clearing was achieved b/t trials. Intermittent bolus piecemealing occurred w/ an involuntary, f/u swallow to finish clearing any residue noted. During the Pharyngeal phase, pt exhibited a timely pharyngeal swallow initiation; pharyngeal swallowing appeared to trigger at the level of the BOT-valleculae. Full airway closure noted during the swallowing. Pt adequately cleared the pharynx of bolus residue post swallowing indicating adequate pharyngeal pressure and laryngeal excursion during the swallowing. NO laryngeal penetration or aspiration was noted during this exam. The Barium tablet cleared the oropharynx and Cervical Esophagus appropriately when given in the TSP of Applesauce. Of note, before, during, and after the MBSS, pt exhibited constant Belching, or Aerophagia, and felt it increased during and after the po trials.  However, during this exam w/ the trials of Liquids and puree/soft solid foods, Esophageal dysmotility was noted in the Distal Esophagus w/ moderately slow (appearing) clearing even w/ Liquids. This Esophageal dysmotility occurred during all po trials and during the presentation of the  Barium tablet (given in puree) which remained in the mid-lower Cervical Esophagus for an extended period of time b/f moving more distally. This presentation of Esophageal dysmotility could be related to pt's c/o discomfort as reported to MD(difficulty swallowing pills, food seems to get stuck in the middle of her chest). This Esophageal dysmotility could also help explain pt's Belching, or Aerophagia. Any Esophageal dysmotility or regurgitation of food/liquid can increase risk for aspiration of Reflux material thus Pulmonary decline. Education given to both pt and Son on the results of this study; Esophageal dysmotility and its impact. Encouraged pt to f/u w/ GI and PCP on the topic of Esophageal dysmotility to include potential causes for the Belching, or Aerophagia, her GERD management/tx, and any further assessment. Recommended monitoring foods that are problematic but in general cut all foods small, moisten well, eat more Soups and give Time b/t bites/sips during meals to allow for Esophageal clearing. Discussed the use of Applesauce, Yogurt, ice cream for aiding swallowing Pills(whole), and NOT to use straws when drinking to lessen air intake/swallowing. Recommended f/u w/ Dietitian/PCP for a supplement potentially for nutritional support. Pt and Son agreed.    PLAN/RECOMMENDATIONS:  A. Diet: Mech Soft foods/diet - moistened well, cut small; Thin liquids via Cup - no straw to reduce air swallowed. Pills WHOLE in Puree for easier swallowing and clearing.  B. Swallowing Precautions: general aspiration precautions; REFLUX precautions - NO peppermint  C. Recommended consultation to f/u w/ GI for  further assessment of Esophgeal motility; Belching(Aerophagia); GERD management/tx  D. Therapy recommendations: None  E. Results and recommendations were discussed w/ pt and Son; education given; recommendations given; video viewed and questions answered             Pharyngoesophageal dysphagia - Plan: DG  OP Swallowing Func-Medicare/Speech Path, DG OP Swallowing Func-Medicare/Speech Path        Problem List Patient Active Problem List   Diagnosis Date Noted  . At high risk for falls 05/19/2016  . Right knee pain 10/12/2015  . Allergic rhinitis 10/12/2015  . Weight loss 10/12/2015  . CAD (coronary artery disease) 10/12/2015  . Low back pain 10/12/2015  . Shoulder pain 10/12/2015  . CKD (chronic kidney disease) stage 3, GFR 30-59 ml/min (HCC) 10/03/2015  . Anterolisthesis 09/15/2015  . Ectatic thoracic aorta ([place]) 08/21/2015  . Pulmonary nodule, left 08/21/2015  . Weakness 05/21/2015  . Dysphagia 05/21/2015  . Arteriosclerosis of coronary artery 05/15/2015  . Benign essential HTN 05/15/2015  . Hernia of pelvic floor 04/30/2015  . Breast cancer screening 04/30/2015  . Macrocytosis 04/12/2015  . Senile purpura ([place]) 04/12/2015  . Fatigue 04/09/2015  . Hematuria, microscopic 04/09/2015  . Dementia ([place]) 04/09/2015  . Dyslipidemia 04/09/2015  . Medication monitoring encounter 04/09/2015  . Other specified hypothyroidism 04/09/2015  . Essential hypertension, benign 04/09/2015  . Breathlessness on exertion 10/31/2014  . TI (tricuspid incompetence) 10/31/2014  . MI (mitral incompetence) 10/31/2014  . AI (aortic incompetence) 10/31/2014  . Beat, premature ventricular 03/22/2014  . Paroxysmal atrial fibrillation ([place]) 03/02/2014      Orinda Kenner, MS, CCC-SLP Felicia Terry 03/25/2018, 4:21 PM  [facility] [place], [place], [address] Phone: [phone]   Fax:     Name: Felicia Terry MRN: [id number] Date of Birth: 02-26-1930

## 2018-04-15 DIAGNOSIS — G309 Alzheimer's disease, unspecified: Secondary | ICD-10-CM | POA: Diagnosis not present

## 2018-04-15 DIAGNOSIS — R142 Eructation: Secondary | ICD-10-CM | POA: Diagnosis not present

## 2018-04-15 DIAGNOSIS — F0150 Vascular dementia without behavioral disturbance: Secondary | ICD-10-CM | POA: Diagnosis not present

## 2018-04-15 DIAGNOSIS — F0280 Dementia in other diseases classified elsewhere without behavioral disturbance: Secondary | ICD-10-CM | POA: Diagnosis not present

## 2018-04-15 DIAGNOSIS — R1314 Dysphagia, pharyngoesophageal phase: Secondary | ICD-10-CM | POA: Diagnosis not present

## 2018-04-15 DIAGNOSIS — R4701 Aphasia: Secondary | ICD-10-CM | POA: Diagnosis not present

## 2020-02-05 ENCOUNTER — Other Ambulatory Visit: Payer: Self-pay

## 2020-02-05 ENCOUNTER — Emergency Department: Payer: Medicare Other

## 2020-02-05 ENCOUNTER — Inpatient Hospital Stay
Admission: EM | Admit: 2020-02-05 | Discharge: 2020-02-10 | DRG: 522 | Disposition: A | Discharge status: Skilled Nursing Facility | Payer: Medicare Other | Source: Skilled Nursing Facility | Attending: Internal Medicine | Admitting: Internal Medicine

## 2020-02-05 ENCOUNTER — Encounter: Payer: Self-pay | Admitting: Emergency Medicine

## 2020-02-05 DIAGNOSIS — Z79890 Hormone replacement therapy (postmenopausal): Secondary | ICD-10-CM

## 2020-02-05 DIAGNOSIS — G301 Alzheimer's disease with late onset: Secondary | ICD-10-CM

## 2020-02-05 DIAGNOSIS — D631 Anemia in chronic kidney disease: Secondary | ICD-10-CM | POA: Diagnosis present

## 2020-02-05 DIAGNOSIS — Z9181 History of falling: Secondary | ICD-10-CM

## 2020-02-05 DIAGNOSIS — I129 Hypertensive chronic kidney disease with stage 1 through stage 4 chronic kidney disease, or unspecified chronic kidney disease: Secondary | ICD-10-CM | POA: Diagnosis present

## 2020-02-05 DIAGNOSIS — E039 Hypothyroidism, unspecified: Secondary | ICD-10-CM | POA: Diagnosis present

## 2020-02-05 DIAGNOSIS — W1830XA Fall on same level, unspecified, initial encounter: Secondary | ICD-10-CM | POA: Diagnosis present

## 2020-02-05 DIAGNOSIS — F0391 Unspecified dementia with behavioral disturbance: Secondary | ICD-10-CM | POA: Diagnosis present

## 2020-02-05 DIAGNOSIS — S72009A Fracture of unspecified part of neck of unspecified femur, initial encounter for closed fracture: Secondary | ICD-10-CM

## 2020-02-05 DIAGNOSIS — Z66 Do not resuscitate: Secondary | ICD-10-CM | POA: Diagnosis present

## 2020-02-05 DIAGNOSIS — E785 Hyperlipidemia, unspecified: Secondary | ICD-10-CM | POA: Diagnosis present

## 2020-02-05 DIAGNOSIS — M25552 Pain in left hip: Secondary | ICD-10-CM | POA: Diagnosis not present

## 2020-02-05 DIAGNOSIS — Z7982 Long term (current) use of aspirin: Secondary | ICD-10-CM

## 2020-02-05 DIAGNOSIS — I10 Essential (primary) hypertension: Secondary | ICD-10-CM

## 2020-02-05 DIAGNOSIS — N182 Chronic kidney disease, stage 2 (mild): Secondary | ICD-10-CM

## 2020-02-05 DIAGNOSIS — K219 Gastro-esophageal reflux disease without esophagitis: Secondary | ICD-10-CM | POA: Diagnosis present

## 2020-02-05 DIAGNOSIS — S72002A Fracture of unspecified part of neck of left femur, initial encounter for closed fracture: Secondary | ICD-10-CM | POA: Diagnosis present

## 2020-02-05 DIAGNOSIS — I480 Paroxysmal atrial fibrillation: Secondary | ICD-10-CM | POA: Diagnosis present

## 2020-02-05 DIAGNOSIS — I959 Hypotension, unspecified: Secondary | ICD-10-CM | POA: Diagnosis not present

## 2020-02-05 DIAGNOSIS — Z01811 Encounter for preprocedural respiratory examination: Secondary | ICD-10-CM

## 2020-02-05 DIAGNOSIS — W19XXXA Unspecified fall, initial encounter: Secondary | ICD-10-CM

## 2020-02-05 DIAGNOSIS — Z79899 Other long term (current) drug therapy: Secondary | ICD-10-CM

## 2020-02-05 DIAGNOSIS — Z20822 Contact with and (suspected) exposure to covid-19: Secondary | ICD-10-CM | POA: Diagnosis present

## 2020-02-05 DIAGNOSIS — F0390 Unspecified dementia without behavioral disturbance: Secondary | ICD-10-CM | POA: Diagnosis present

## 2020-02-05 DIAGNOSIS — S72032A Displaced midcervical fracture of left femur, initial encounter for closed fracture: Secondary | ICD-10-CM | POA: Diagnosis not present

## 2020-02-05 DIAGNOSIS — L89151 Pressure ulcer of sacral region, stage 1: Secondary | ICD-10-CM | POA: Diagnosis present

## 2020-02-05 DIAGNOSIS — Z955 Presence of coronary angioplasty implant and graft: Secondary | ICD-10-CM

## 2020-02-05 DIAGNOSIS — I2510 Atherosclerotic heart disease of native coronary artery without angina pectoris: Secondary | ICD-10-CM | POA: Diagnosis present

## 2020-02-05 NOTE — ED Triage Notes (Signed)
Pt arrives [facility] with c/o left femur pain. Pt had an unwitnessed fall outside of her bathroom. Pt is in NAD. Per [facility], VS WDL.

## 2020-02-05 NOTE — ED Provider Notes (Signed)
[facility] Emergency Department Provider Note  ____________________________________________  Time seen: Approximately 11:55 PM  I have reviewed the triage vital signs and the nursing notes.   HISTORY  Chief Complaint Fall   HPI Felicia Terry is a 86 y.o. female with a history of CAD status post stent, CKD, hypertension, dementia, paroxysmal atrial fibrillation not on anticoagulation who presents from her nursing home for evaluation of unwitnessed fall.   According to EMS, patient was found on the floor outside of her bathroom complaining of left leg pain.  Patient is at baseline and unable to provide any history due to advanced dementia and other than hold her left leg and crying pain.    Past Medical History:  Diagnosis Date  . Coronary artery disease    cardiac stent  . Hypertension   . Reynolds syndrome [facility])     Patient Active Problem List   Diagnosis Date Noted  . At high risk for falls 05/19/2016  . Right knee pain 10/12/2015  . Allergic rhinitis 10/12/2015  . Weight loss 10/12/2015  . CAD (coronary artery disease) 10/12/2015  . Low back pain 10/12/2015  . Shoulder pain 10/12/2015  . CKD (chronic kidney disease) stage 3, GFR 30-59 ml/min 10/03/2015  . Anterolisthesis 09/15/2015  . Ectatic thoracic aorta ([place]) 08/21/2015  . Pulmonary nodule, left 08/21/2015  . Weakness 05/21/2015  . Dysphagia 05/21/2015  . Arteriosclerosis of coronary artery 05/15/2015  . Benign essential HTN 05/15/2015  . Hernia of pelvic floor 04/30/2015  . Breast cancer screening 04/30/2015  . Macrocytosis 04/12/2015  . Senile purpura ([place]) 04/12/2015  . Fatigue 04/09/2015  . Hematuria, microscopic 04/09/2015  . Dementia ([place]) 04/09/2015  . Dyslipidemia 04/09/2015  . Medication monitoring encounter 04/09/2015  . Other specified hypothyroidism 04/09/2015  . Essential hypertension, benign 04/09/2015  . Breathlessness on exertion 10/31/2014  . TI (tricuspid  incompetence) 10/31/2014  . MI (mitral incompetence) 10/31/2014  . AI (aortic incompetence) 10/31/2014  . Beat, premature ventricular 03/22/2014  . Paroxysmal atrial fibrillation ([place]) 03/02/2014    Past Surgical History:  Procedure Laterality Date  . ABDOMINAL HYSTERECTOMY      Prior to Admission medications   Medication Sig Start Date End Date Taking? Authorizing Provider  acetaminophen (TYLENOL ARTHRITIS PAIN) 650 MG CR tablet Take 650 mg by mouth every 8 (eight) hours as needed for pain.    [provider]  aspirin EC 81 MG tablet Take 81 mg by mouth daily.    [provider]  cetaphil (CETAPHIL) lotion Apply 1 application topically as needed for dry skin.    [provider]  cromolyn (OPTICROM) 4 % ophthalmic solution Place 1 drop into the left eye 4 (four) times daily. Patient not taking: Reported on 05/19/2016 04/07/16   [name], NP  diltiazem (CARDIZEM) 30 MG tablet Take 1 tablet by mouth 2 (two) times daily. 05/16/15   [provider]  donepezil (ARICEPT) 5 MG tablet Take 1 tablet (5 mg total) by mouth at bedtime. Patient not taking: Reported on 03/10/2017 04/07/16   [name], NP  esomeprazole (NEXIUM) 40 MG capsule Take 40 mg by mouth daily.    [provider]  ipratropium (ATROVENT) 0.03 % nasal spray Place 2 sprays into both nostrils every 12 (twelve) hours. Patient not taking: Reported on 05/19/2016 10/12/15   [name], NP  levothyroxine (SYNTHROID, LEVOTHROID) 25 MCG tablet Take 1 tablet (25 mcg total) by mouth daily. Patient taking differently: Take 25 mcg by mouth daily.  03/28/16   [name], NP  lisinopril (PRINIVIL,ZESTRIL) 2.5 MG tablet Take 1 tablet (2.5 mg total) by mouth daily. 04/07/16   [name], NP  meloxicam (MOBIC) 7.5 MG tablet Take 7.5 mg by mouth daily. 03/09/17   [provider]  mirtazapine (REMERON) 7.5 MG tablet Take 1 tablet by mouth daily. 01/13/17   [provider]    Multiple Vitamin (MULTI-VITAMINS) TABS Take by mouth daily.    [provider]  neomycin-polymyxin b-dexamethasone (MAXITROL) 3.5-10000-0.1 SUSP Place 1 drop into both eyes as needed. 01/15/17   [provider]  polyethylene glycol (MIRALAX / GLYCOLAX) packet Take 17 g by mouth every other day.    [provider]  pravastatin (PRAVACHOL) 40 MG tablet Take 40 mg by mouth at bedtime.    [provider]  propranolol (INNOPRAN XL) 120 MG 24 hr capsule Take 240 mg by mouth 2 (two) times daily.    [provider]  triamcinolone cream (KENALOG) 0.1 % Apply 1 application topically 2 (two) times daily. Too strong for face Patient not taking: Reported on 05/19/2016 08/31/15   Arnetha Courser, MD    Allergies Patient has no known allergies.  Family History  Problem Relation Age of Onset  . Diabetes Mother   . Diabetes Son   . Cancer Neg Hx   . Heart disease Neg Hx   . Stroke Neg Hx     Social History Social History   Tobacco Use  . Smoking status: Never Smoker  . Smokeless tobacco: Never Used  Substance Use Topics  . Alcohol use: No  . Drug use: No    Review of Systems  Constitutional: Negative for fever. Eyes: Negative for visual changes. ENT: Negative for facial injury or neck injury Cardiovascular: Negative for chest injury. Respiratory: Negative for shortness of breath. Negative for chest wall injury. Gastrointestinal: Negative for abdominal pain or injury. Genitourinary: Negative for dysuria. Musculoskeletal: Negative for back injury, + L leg / hip pain Skin: Negative for laceration/abrasions. Neurological: Negative for head injury.   ____________________________________________   PHYSICAL EXAM:  VITAL SIGNS: ED Triage Vitals  Enc Vitals Group     BP 02/05/20 2306 (!) 156/77     Pulse Rate 02/05/20 2306 61     Resp 02/05/20 2306 20     Temp 02/05/20 2306 98.8 F (37.1 C)     Temp Source 02/05/20 2306 Oral     SpO2  02/05/20 2306 96 %     Weight 02/05/20 2300 119 lb 14.9 oz (54.4 kg)     Height 02/05/20 2300 5\' 2"  (1.575 m)     Head Circumference --      Peak Flow --      Pain Score --      Pain Loc --      Pain Edu? --      Excl. in [place]? --     Full spinal precautions maintained throughout the trauma exam. Constitutional: Alert, follows simple commands. No acute distress. Does not appear intoxicated. HEENT Head: Normocephalic and atraumatic. Face: No facial bony tenderness. Stable midface Ears: No hemotympanum bilaterally. No Battle sign Eyes: No eye injury. PERRL. No raccoon eyes Nose: Nontender. No epistaxis. No rhinorrhea Mouth/Throat: Mucous membranes are moist. No oropharyngeal blood. No dental injury. Airway patent without stridor. Normal voice. Neck: no C-collar. No midline c-spine tenderness.  Cardiovascular: Normal rate, regular rhythm. Normal and symmetric distal pulses are present in all extremities. Pulmonary/Chest: Chest wall is stable and nontender to  palpation/compression. Normal respiratory effort. Breath sounds are normal. No crepitus.  Abdominal: Soft, nontender, non distended. Musculoskeletal: Patient is tender to palpation on the left hip and femur area with no obvious deformity. nontender with normal full range of motion in all other extremities. No deformities. No thoracic or lumbar midline spinal tenderness. Pelvis is stable. Skin: Skin is warm, dry and intact. No abrasions or contutions. Psychiatric: Speech and behavior are appropriate. Neurological: Normal speech and language. Moves all extremities to command. No gross focal neurologic deficits are appreciated.  Gl[place]ow Coma Score: 4 - Opens eyes on own 6 - Follows simple motor commands 4 - Seems confused, disoriented GCS: 14   ____________________________________________   LABS (all labs ordered are listed, but only abnormal results are displayed)  Labs Reviewed  CBC  COMPREHENSIVE METABOLIC PANEL    URINALYSIS, COMPLETE (UACMP) WITH MICROSCOPIC  APTT  PROTIME-INR  TYPE AND SCREEN  TROPONIN I (HIGH SENSITIVITY)   ____________________________________________  EKG  ED ECG REPORT I, Rudene Re, the attending physician, personally viewed and interpreted this ECG.  Normal sinus rhythm, rate of 70 beats, normal intervals, normal axis, no ST elevations or depressions.  Anterior Q waves, new when compared to prior from 2018 ____________________________________________  RADIOLOGY  I have personally reviewed the images performed during this visit and I agree with the Radiologist's read.   Interpretation by Radiologist:  DG Chest 1 View  Result Date: 02/06/2020 CLINICAL DATA:  Status post fall.  Preoperative evaluation. EXAM: CHEST  1 VIEW COMPARISON:  February 09, 2018 FINDINGS: There is no evidence of acute infiltrate, pleural effusion or pneumothorax. The heart size and mediastinal contours are within normal limits. There is marked severity calcification of the aortic arch. Degenerative changes seen involving the bilateral shoulders and thoracic spine. IMPRESSION: No active disease. Electronically Signed   By: Virgina Norfolk M.D.   On: 02/06/2020 00:03   CT Head Wo Contrast  Result Date: 02/06/2020 CLINICAL DATA:  Fall EXAM: CT HEAD WITHOUT CONTRAST TECHNIQUE: Contiguous axial images were obtained from the base of the skull through the vertex without intravenous contrast. COMPARISON:  None. FINDINGS: Brain: No evidence of acute territorial infarction, hemorrhage, hydrocephalus,extra-axial collection or mass lesion/mass effect. There is dilatation the ventricles and sulci consistent with age-related atrophy. Low-attenuation changes in the deep white matter consistent with small vessel ischemia. Vascular: No hyperdense vessel or unexpected calcification. Skull: The skull is intact. No fracture or focal lesion identified. Sinuses/Orbits: The visualized paranasal sinuses and mastoid  air cells are clear. The orbits and globes intact. Other: None Cervical spine: Alignment: There is a grade 1 anterolisthesis C3-C4 measuring 3 mm C4-C5 measuring 4 mm which is unchanged since 2014. Skull base and vertebrae: Visualized skull base is intact. No atlanto-occipital dissociation. The vertebral body heights are well maintained. No fracture or pathologic osseous lesion seen. Soft tissues and spinal canal: The visualized paraspinal soft tissues are unremarkable. No prevertebral soft tissue swelling is seen. The spinal canal is grossly unremarkable, no large epidural collection or significant canal narrowing. Disc levels: Multilevel spine spondylosis with disc osteophyte uncovertebral osteophytes most notable C5-C6 severe neural foraminal narrowing and moderate central canal stenosis. Upper chest: Biapical scarring is seen. Thoracic inlet is within normal limits. Other: None IMPRESSION: No acute intracranial abnormality. Findings consistent with age related atrophy and chronic small vessel ischemia No acute fracture or malalignment of the spine. Electronically Signed   By: Prudencio Pair M.D.   On: 02/06/2020 00:12   CT Cervical Spine Wo Contrast  Result Date: 02/06/2020 CLINICAL DATA:  Fall EXAM: CT HEAD WITHOUT CONTRAST TECHNIQUE: Contiguous axial images were obtained from the base of the skull through the vertex without intravenous contrast. COMPARISON:  None. FINDINGS: Brain: No evidence of acute territorial infarction, hemorrhage, hydrocephalus,extra-axial collection or mass lesion/mass effect. There is dilatation the ventricles and sulci consistent with age-related atrophy. Low-attenuation changes in the deep white matter consistent with small vessel ischemia. Vascular: No hyperdense vessel or unexpected calcification. Skull: The skull is intact. No fracture or focal lesion identified. Sinuses/Orbits: The visualized paranasal sinuses and mastoid air cells are clear. The orbits and globes intact.  Other: None Cervical spine: Alignment: There is a grade 1 anterolisthesis C3-C4 measuring 3 mm C4-C5 measuring 4 mm which is unchanged since 2014. Skull base and vertebrae: Visualized skull base is intact. No atlanto-occipital dissociation. The vertebral body heights are well maintained. No fracture or pathologic osseous lesion seen. Soft tissues and spinal canal: The visualized paraspinal soft tissues are unremarkable. No prevertebral soft tissue swelling is seen. The spinal canal is grossly unremarkable, no large epidural collection or significant canal narrowing. Disc levels: Multilevel spine spondylosis with disc osteophyte uncovertebral osteophytes most notable C5-C6 severe neural foraminal narrowing and moderate central canal stenosis. Upper chest: Biapical scarring is seen. Thoracic inlet is within normal limits. Other: None IMPRESSION: No acute intracranial abnormality. Findings consistent with age related atrophy and chronic small vessel ischemia No acute fracture or malalignment of the spine. Electronically Signed   By: Prudencio Pair M.D.   On: 02/06/2020 00:12   DG Hip Unilat W or Wo Pelvis 2-3 Views Left  Result Date: 02/06/2020 CLINICAL DATA:  Fall and pain EXAM: DG HIP (WITH OR WITHOUT PELVIS) 2-3V LEFT COMPARISON:  None. FINDINGS: There is a displaced transcervical femoral neck fracture of the left hip. There is superior displacement of the femoral shaft. The femoral head is rotated however still within the acetabulum. There is diffuse osteopenia noted. Overlying soft tissue swelling. IMPRESSION: Displaced left transcervical femoral neck fracture. Electronically Signed   By: Prudencio Pair M.D.   On: 02/06/2020 00:06      ____________________________________________   PROCEDURES  Procedure(s) performed:yes .1-3 Lead EKG Interpretation Performed by: Rudene Re, MD Authorized by: Rudene Re, MD     Interpretation: normal     ECG rate assessment: normal     Rhythm: sinus  rhythm     Ectopy: none     Critical Care performed:  None ____________________________________________   INITIAL IMPRESSION / ASSESSMENT AND PLAN / ED COURSE  86 y.o. female with a history of CAD status post stent, CKD, hypertension, dementia, paroxysmal atrial fibrillation not on anticoagulation who presents from her nursing home for evaluation of unwitnessed fall.   Patient with significant pain of the left hip.  X-rays are pending.  CT head and cervical spine pending.  EKG showing no dysrhythmias.  Basic labs have been ordered to rule out UTI, AKI, electrolyte abnormalities, cardiac event, dehydration, anemia as possible etiology of patient's fall.  Patient placed on telemetry for close monitoring.  Old medical records reviewed.  _________________________ 12:26 AM on 02/06/2020 -----------------------------------------  X-ray consistent with a left femoral neck fracture.  Discussed with Dr. Mack Guise from orthopedics recommend admission to the hospitalist service for surgical repair tomorrow.  I was able to get a hold of patient's son over the phone and informed him of fracture and need for surgical repair.  Son is in agreement.  CT head and cervical spine showing no acute traumatic injuries.  Labs for preop are pending.  Will consult the hospitalist for admission.     ____________________________________________  Please note:  Patient was evaluated in Emergency Department today for the symptoms described in the history of present illness. Patient was evaluated in the context of the global COVID-19 pandemic, which necessitated consideration that the patient might be at risk for infection with the SARS-CoV-2 virus that causes COVID-19. Institutional protocols and algorithms that pertain to the evaluation of patients at risk for COVID-19 are in a state of rapid change based on information released by regulatory bodies including the CDC and federal and state organizations. These policies and  algorithms were followed during the patient's care in the ED.  Some ED evaluations and interventions may be delayed as a result of limited staffing during the pandemic.   ____________________________________________   FINAL CLINICAL IMPRESSION(S) / ED DIAGNOSES   Final diagnoses:  Fall, initial encounter  Closed fracture of left hip, initial encounter ([place])      NEW MEDICATIONS STARTED DURING THIS VISIT:  ED Discharge Orders    None       Note:  This document was prepared using Dragon voice recognition software and may include unintentional dictation errors.    Rudene Re, MD 02/06/20 [phone]

## 2020-02-06 ENCOUNTER — Inpatient Hospital Stay: Payer: Medicare Other | Admitting: Anesthesiology

## 2020-02-06 ENCOUNTER — Encounter: Payer: Self-pay | Admitting: Family Medicine

## 2020-02-06 ENCOUNTER — Encounter
Admission: EM | Disposition: A | Discharge status: Skilled Nursing Facility | Payer: Self-pay | Source: Skilled Nursing Facility | Attending: Internal Medicine

## 2020-02-06 ENCOUNTER — Inpatient Hospital Stay: Payer: Medicare Other

## 2020-02-06 DIAGNOSIS — N182 Chronic kidney disease, stage 2 (mild): Secondary | ICD-10-CM | POA: Diagnosis present

## 2020-02-06 DIAGNOSIS — I129 Hypertensive chronic kidney disease with stage 1 through stage 4 chronic kidney disease, or unspecified chronic kidney disease: Secondary | ICD-10-CM | POA: Diagnosis present

## 2020-02-06 DIAGNOSIS — I10 Essential (primary) hypertension: Secondary | ICD-10-CM

## 2020-02-06 DIAGNOSIS — K219 Gastro-esophageal reflux disease without esophagitis: Secondary | ICD-10-CM | POA: Diagnosis present

## 2020-02-06 DIAGNOSIS — L89151 Pressure ulcer of sacral region, stage 1: Secondary | ICD-10-CM | POA: Diagnosis present

## 2020-02-06 DIAGNOSIS — E785 Hyperlipidemia, unspecified: Secondary | ICD-10-CM | POA: Diagnosis present

## 2020-02-06 DIAGNOSIS — W1830XA Fall on same level, unspecified, initial encounter: Secondary | ICD-10-CM | POA: Diagnosis present

## 2020-02-06 DIAGNOSIS — Z66 Do not resuscitate: Secondary | ICD-10-CM | POA: Diagnosis present

## 2020-02-06 DIAGNOSIS — F0280 Dementia in other diseases classified elsewhere without behavioral disturbance: Secondary | ICD-10-CM

## 2020-02-06 DIAGNOSIS — Z955 Presence of coronary angioplasty implant and graft: Secondary | ICD-10-CM | POA: Diagnosis not present

## 2020-02-06 DIAGNOSIS — S72002P Fracture of unspecified part of neck of left femur, subsequent encounter for closed fracture with malunion: Secondary | ICD-10-CM | POA: Diagnosis not present

## 2020-02-06 DIAGNOSIS — E039 Hypothyroidism, unspecified: Secondary | ICD-10-CM

## 2020-02-06 DIAGNOSIS — I480 Paroxysmal atrial fibrillation: Secondary | ICD-10-CM | POA: Diagnosis present

## 2020-02-06 DIAGNOSIS — G301 Alzheimer's disease with late onset: Secondary | ICD-10-CM

## 2020-02-06 DIAGNOSIS — Z9181 History of falling: Secondary | ICD-10-CM | POA: Diagnosis not present

## 2020-02-06 DIAGNOSIS — I959 Hypotension, unspecified: Secondary | ICD-10-CM | POA: Diagnosis not present

## 2020-02-06 DIAGNOSIS — S72002A Fracture of unspecified part of neck of left femur, initial encounter for closed fracture: Secondary | ICD-10-CM | POA: Diagnosis present

## 2020-02-06 DIAGNOSIS — I2510 Atherosclerotic heart disease of native coronary artery without angina pectoris: Secondary | ICD-10-CM | POA: Diagnosis present

## 2020-02-06 DIAGNOSIS — Z20822 Contact with and (suspected) exposure to covid-19: Secondary | ICD-10-CM | POA: Diagnosis present

## 2020-02-06 DIAGNOSIS — Z7982 Long term (current) use of aspirin: Secondary | ICD-10-CM | POA: Diagnosis not present

## 2020-02-06 DIAGNOSIS — F0391 Unspecified dementia with behavioral disturbance: Secondary | ICD-10-CM | POA: Diagnosis present

## 2020-02-06 DIAGNOSIS — M25552 Pain in left hip: Secondary | ICD-10-CM | POA: Diagnosis present

## 2020-02-06 DIAGNOSIS — N1831 Chronic kidney disease, stage 3a: Secondary | ICD-10-CM

## 2020-02-06 DIAGNOSIS — S72032A Displaced midcervical fracture of left femur, initial encounter for closed fracture: Secondary | ICD-10-CM | POA: Diagnosis present

## 2020-02-06 DIAGNOSIS — D631 Anemia in chronic kidney disease: Secondary | ICD-10-CM | POA: Diagnosis present

## 2020-02-06 DIAGNOSIS — Z79890 Hormone replacement therapy (postmenopausal): Secondary | ICD-10-CM | POA: Diagnosis not present

## 2020-02-06 DIAGNOSIS — Z79899 Other long term (current) drug therapy: Secondary | ICD-10-CM | POA: Diagnosis not present

## 2020-02-06 HISTORY — PX: HIP ARTHROPLASTY: SHX981

## 2020-02-06 LAB — URINALYSIS, COMPLETE (UACMP) WITH MICROSCOPIC
Bilirubin Urine: NEGATIVE
Glucose, UA: NEGATIVE mg/dL
Ketones, ur: 5 mg/dL — AB
Nitrite: NEGATIVE
Protein, ur: NEGATIVE mg/dL
Specific Gravity, Urine: 1.012 (ref 1.005–1.030)
pH: 7 (ref 5.0–8.0)

## 2020-02-06 LAB — SURGICAL PCR SCREEN
MRSA, PCR: NEGATIVE
Staphylococcus aureus: NEGATIVE

## 2020-02-06 LAB — CBC
HCT: 35.3 % — ABNORMAL LOW (ref 36.0–46.0)
HCT: 34.5 % — ABNORMAL LOW (ref 36.0–46.0)
Hemoglobin: 11.7 g/dL — ABNORMAL LOW (ref 12.0–15.0)
Hemoglobin: 11.4 g/dL — ABNORMAL LOW (ref 12.0–15.0)
MCH: 33.1 pg (ref 26.0–34.0)
MCH: 32.9 pg (ref 26.0–34.0)
MCHC: 33.1 g/dL (ref 30.0–36.0)
MCHC: 33 g/dL (ref 30.0–36.0)
MCV: 100 fL (ref 80.0–100.0)
MCV: 99.4 fL (ref 80.0–100.0)
Platelets: 189 10*3/uL (ref 150–400)
Platelets: 173 10*3/uL (ref 150–400)
RBC: 3.53 MIL/uL — ABNORMAL LOW (ref 3.87–5.11)
RBC: 3.47 MIL/uL — ABNORMAL LOW (ref 3.87–5.11)
RDW: 14.2 % (ref 11.5–15.5)
RDW: 14.1 % (ref 11.5–15.5)
WBC: 9 10*3/uL (ref 4.0–10.5)
WBC: 11.4 10*3/uL — ABNORMAL HIGH (ref 4.0–10.5)
nRBC: 0 % (ref 0.0–0.2)
nRBC: 0 % (ref 0.0–0.2)

## 2020-02-06 LAB — TYPE AND SCREEN
ABO/RH(D): A POS
ABO/RH(D): A POS
Antibody Screen: NEGATIVE
Antibody Screen: NEGATIVE

## 2020-02-06 LAB — COMPREHENSIVE METABOLIC PANEL
ALT: 17 U/L (ref 0–44)
AST: 27 U/L (ref 15–41)
Albumin: 3.7 g/dL (ref 3.5–5.0)
Alkaline Phosphatase: 71 U/L (ref 38–126)
Anion gap: 11 (ref 5–15)
BUN: 21 mg/dL (ref 8–23)
CO2: 24 mmol/L (ref 22–32)
Calcium: 8.7 mg/dL — ABNORMAL LOW (ref 8.9–10.3)
Chloride: 102 mmol/L (ref 98–111)
Creatinine, Ser: 0.79 mg/dL (ref 0.44–1.00)
GFR calc Af Amer: >60 mL/min (ref >60)
GFR calc non Af Amer: >60 mL/min (ref >60)
Glucose, Bld: 106 mg/dL — ABNORMAL HIGH (ref 70–99)
Potassium: 3.6 mmol/L (ref 3.5–5.1)
Sodium: 137 mmol/L (ref 135–145)
Total Bilirubin: 0.9 mg/dL (ref 0.3–1.2)
Total Protein: 6.8 g/dL (ref 6.5–8.1)

## 2020-02-06 LAB — BASIC METABOLIC PANEL
Anion gap: 9 (ref 5–15)
BUN: 21 mg/dL (ref 8–23)
CO2: 25 mmol/L (ref 22–32)
Calcium: 8.3 mg/dL — ABNORMAL LOW (ref 8.9–10.3)
Chloride: 105 mmol/L (ref 98–111)
Creatinine, Ser: 0.76 mg/dL (ref 0.44–1.00)
GFR calc Af Amer: >60 mL/min (ref >60)
GFR calc non Af Amer: >60 mL/min (ref >60)
Glucose, Bld: 117 mg/dL — ABNORMAL HIGH (ref 70–99)
Potassium: 3.8 mmol/L (ref 3.5–5.1)
Sodium: 139 mmol/L (ref 135–145)

## 2020-02-06 LAB — SARS CORONAVIRUS 2 BY RT PCR (HOSPITAL ORDER, PERFORMED IN ~~LOC~~ HOSPITAL LAB): SARS Coronavirus 2: NEGATIVE

## 2020-02-06 LAB — PROTIME-INR
INR: 1 (ref 0.8–1.2)
Prothrombin Time: 12.4 seconds (ref 11.4–15.2)

## 2020-02-06 LAB — APTT: aPTT: 34 seconds (ref 24–36)

## 2020-02-06 LAB — TROPONIN I (HIGH SENSITIVITY): Troponin I (High Sensitivity): 17 ng/L (ref <18)

## 2020-02-06 SURGERY — HEMIARTHROPLASTY, HIP, DIRECT ANTERIOR APPROACH, FOR FRACTURE
Anesthesia: Spinal | Site: Hip | Laterality: Left

## 2020-02-06 MED ORDER — PROPOFOL 500 MG/50ML IV EMUL
INTRAVENOUS | Status: AC
  Filled 2020-02-06: qty 50

## 2020-02-06 MED ORDER — OXYCODONE HCL 5 MG/5ML PO SOLN: 5.0000 mg | Freq: Once | ORAL | Status: DC | PRN

## 2020-02-06 MED ORDER — ADULT MULTIVITAMIN W/MINERALS CH
ORAL_TABLET | Freq: Every day | ORAL | Status: DC
  Administered 2020-02-10: 1 via ORAL
  Filled 2020-02-06 (×4): qty 1

## 2020-02-06 MED ORDER — ONDANSETRON HCL 4 MG/2ML IJ SOLN: 4.0000 mg | Freq: Once | INTRAMUSCULAR | Status: DC | PRN

## 2020-02-06 MED ORDER — ACETAMINOPHEN 325 MG PO TABS: 650.0000 mg | ORAL_TABLET | Freq: Four times a day (QID) | ORAL | Status: DC | PRN

## 2020-02-06 MED ORDER — MIRTAZAPINE 15 MG PO TABS
7.5000 mg | ORAL_TABLET | Freq: Every day | ORAL | Status: DC
  Administered 2020-02-08 – 2020-02-10 (×2): 7.5 mg via ORAL
  Filled 2020-02-06 (×4): qty 1

## 2020-02-06 MED ORDER — FENTANYL CITRATE (PF) 100 MCG/2ML IJ SOLN
INTRAMUSCULAR | Status: AC
  Filled 2020-02-06: qty 2

## 2020-02-06 MED ORDER — SENNA 8.6 MG PO TABS
1.0000 | ORAL_TABLET | Freq: Two times a day (BID) | ORAL | Status: DC
  Administered 2020-02-06 – 2020-02-10 (×3): 8.6 mg via ORAL
  Filled 2020-02-06 (×7): qty 1

## 2020-02-06 MED ORDER — BISACODYL 10 MG RE SUPP: 10.0000 mg | Freq: Every day | RECTAL | Status: DC | PRN

## 2020-02-06 MED ORDER — SODIUM CHLORIDE 0.9 % IV SOLN
INTRAVENOUS | Status: DC | PRN
  Administered 2020-02-06: 40 ug/min via INTRAVENOUS

## 2020-02-06 MED ORDER — PROPRANOLOL HCL ER 120 MG PO CP24
240.0000 mg | ORAL_CAPSULE | Freq: Two times a day (BID) | ORAL | Status: DC
  Administered 2020-02-06: 240 mg via ORAL
  Filled 2020-02-06 (×5): qty 2

## 2020-02-06 MED ORDER — ONDANSETRON HCL 4 MG PO TABS: 4.0000 mg | ORAL_TABLET | Freq: Four times a day (QID) | ORAL | Status: DC | PRN

## 2020-02-06 MED ORDER — EPHEDRINE SULFATE 50 MG/ML IJ SOLN
INTRAMUSCULAR | Status: DC | PRN
  Administered 2020-02-06: 10 mg via INTRAVENOUS

## 2020-02-06 MED ORDER — LISINOPRIL 5 MG PO TABS
2.5000 mg | ORAL_TABLET | Freq: Every day | ORAL | Status: DC
  Filled 2020-02-06: qty 1

## 2020-02-06 MED ORDER — PROPOFOL 500 MG/50ML IV EMUL
INTRAVENOUS | Status: DC | PRN
  Administered 2020-02-06: 40 ug/kg/min via INTRAVENOUS

## 2020-02-06 MED ORDER — PHENYLEPHRINE HCL (PRESSORS) 10 MG/ML IV SOLN
INTRAVENOUS | Status: DC | PRN
  Administered 2020-02-06: 100 ug via INTRAVENOUS

## 2020-02-06 MED ORDER — POLYETHYLENE GLYCOL 3350 17 G PO PACK: 17.0000 g | PACK | ORAL | Status: DC

## 2020-02-06 MED ORDER — PANTOPRAZOLE SODIUM 40 MG PO TBEC
40.0000 mg | DELAYED_RELEASE_TABLET | Freq: Every day | ORAL | Status: DC
  Administered 2020-02-10: 40 mg via ORAL
  Filled 2020-02-06 (×4): qty 1

## 2020-02-06 MED ORDER — CEFAZOLIN SODIUM-DEXTROSE 1-4 GM/50ML-% IV SOLN
1.0000 g | Freq: Four times a day (QID) | INTRAVENOUS | Status: AC
  Administered 2020-02-06 – 2020-02-07 (×2): 1 g via INTRAVENOUS
  Filled 2020-02-06 (×2): qty 50

## 2020-02-06 MED ORDER — ALUM & MAG HYDROXIDE-SIMETH 200-200-20 MG/5ML PO SUSP: 30.0000 mL | ORAL | Status: DC | PRN

## 2020-02-06 MED ORDER — NEOMYCIN-POLYMYXIN B GU 40-200000 IR SOLN
Status: DC | PRN
  Administered 2020-02-06: 16 mL

## 2020-02-06 MED ORDER — BUPIVACAINE HCL (PF) 0.5 % IJ SOLN
INTRAMUSCULAR | Status: DC | PRN
  Administered 2020-02-06: 2.6 mL

## 2020-02-06 MED ORDER — POLYETHYLENE GLYCOL 3350 17 G PO PACK: 17.0000 g | PACK | Freq: Every day | ORAL | Status: DC | PRN

## 2020-02-06 MED ORDER — PROPOFOL 10 MG/ML IV BOLUS
INTRAVENOUS | Status: DC | PRN
  Administered 2020-02-06: 10 mg via INTRAVENOUS
  Administered 2020-02-06: 20 mg via INTRAVENOUS
  Administered 2020-02-06 (×3): 10 mg via INTRAVENOUS

## 2020-02-06 MED ORDER — MORPHINE SULFATE (PF) 2 MG/ML IV SOLN
2.0000 mg | INTRAVENOUS | Status: DC | PRN
  Administered 2020-02-06: 2 mg via INTRAVENOUS
  Filled 2020-02-06: qty 1

## 2020-02-06 MED ORDER — PRAVASTATIN SODIUM 20 MG PO TABS
40.0000 mg | ORAL_TABLET | Freq: Every day | ORAL | Status: DC
  Administered 2020-02-09: 40 mg via ORAL
  Filled 2020-02-06 (×3): qty 2

## 2020-02-06 MED ORDER — ENOXAPARIN SODIUM 40 MG/0.4ML ~~LOC~~ SOLN
40.0000 mg | SUBCUTANEOUS | Status: DC
  Administered 2020-02-07 – 2020-02-10 (×3): 40 mg via SUBCUTANEOUS
  Filled 2020-02-06 (×4): qty 0.4

## 2020-02-06 MED ORDER — KETOROLAC TROMETHAMINE 15 MG/ML IJ SOLN
7.5000 mg | Freq: Four times a day (QID) | INTRAMUSCULAR | Status: AC
  Administered 2020-02-06 – 2020-02-07 (×4): 7.5 mg via INTRAVENOUS
  Filled 2020-02-06 (×4): qty 1

## 2020-02-06 MED ORDER — ACETAMINOPHEN 500 MG PO TABS
500.0000 mg | ORAL_TABLET | Freq: Four times a day (QID) | ORAL | Status: AC
  Administered 2020-02-06 – 2020-02-07 (×2): 500 mg via ORAL
  Filled 2020-02-06 (×3): qty 1

## 2020-02-06 MED ORDER — ONDANSETRON HCL 4 MG/2ML IJ SOLN
INTRAMUSCULAR | Status: AC
  Filled 2020-02-06: qty 2

## 2020-02-06 MED ORDER — MORPHINE SULFATE (PF) 2 MG/ML IV SOLN: 0.5000 mg | INTRAVENOUS | Status: DC | PRN

## 2020-02-06 MED ORDER — DILTIAZEM HCL 25 MG/5ML IV SOLN
10.0000 mg | Freq: Once | INTRAVENOUS | Status: AC
  Administered 2020-02-06: 10 mg via INTRAVENOUS
  Filled 2020-02-06: qty 5

## 2020-02-06 MED ORDER — GLYCOPYRROLATE 0.2 MG/ML IJ SOLN
INTRAMUSCULAR | Status: DC | PRN
  Administered 2020-02-06: .2 mg via INTRAVENOUS

## 2020-02-06 MED ORDER — LEVOTHYROXINE SODIUM 25 MCG PO TABS
25.0000 ug | ORAL_TABLET | Freq: Every day | ORAL | Status: DC
  Administered 2020-02-06 – 2020-02-09 (×4): 25 ug via ORAL
  Filled 2020-02-06 (×5): qty 1

## 2020-02-06 MED ORDER — HYDROCODONE-ACETAMINOPHEN 7.5-325 MG PO TABS: 1.0000 | ORAL_TABLET | ORAL | Status: DC | PRN

## 2020-02-06 MED ORDER — FENTANYL CITRATE (PF) 100 MCG/2ML IJ SOLN
INTRAMUSCULAR | Status: DC | PRN
Start: 2020-02-06 — End: 2020-02-06
  Administered 2020-02-06 (×2): 25 ug via INTRAVENOUS

## 2020-02-06 MED ORDER — ONDANSETRON HCL 4 MG/2ML IJ SOLN
4.0000 mg | Freq: Once | INTRAMUSCULAR | Status: AC
  Administered 2020-02-06: 4 mg via INTRAVENOUS

## 2020-02-06 MED ORDER — FENTANYL CITRATE (PF) 100 MCG/2ML IJ SOLN
50.0000 ug | Freq: Once | INTRAMUSCULAR | Status: AC
  Administered 2020-02-06: 50 ug via INTRAVENOUS

## 2020-02-06 MED ORDER — CEFAZOLIN SODIUM-DEXTROSE 2-4 GM/100ML-% IV SOLN
2.0000 g | INTRAVENOUS | Status: AC
  Administered 2020-02-06: 2 g via INTRAVENOUS
  Filled 2020-02-06: qty 100

## 2020-02-06 MED ORDER — DILTIAZEM HCL 30 MG PO TABS
30.0000 mg | ORAL_TABLET | Freq: Two times a day (BID) | ORAL | Status: DC
  Administered 2020-02-06 – 2020-02-10 (×5): 30 mg via ORAL
  Filled 2020-02-06 (×8): qty 1

## 2020-02-06 MED ORDER — METHOCARBAMOL 1000 MG/10ML IJ SOLN
500.0000 mg | Freq: Four times a day (QID) | INTRAVENOUS | Status: DC | PRN
  Filled 2020-02-06: qty 5

## 2020-02-06 MED ORDER — DONEPEZIL HCL 5 MG PO TABS
5.0000 mg | ORAL_TABLET | Freq: Every day | ORAL | Status: DC
  Administered 2020-02-06 – 2020-02-09 (×4): 5 mg via ORAL
  Filled 2020-02-06 (×5): qty 1

## 2020-02-06 MED ORDER — DOCUSATE SODIUM 100 MG PO CAPS
100.0000 mg | ORAL_CAPSULE | Freq: Two times a day (BID) | ORAL | Status: DC
  Administered 2020-02-06 – 2020-02-09 (×3): 100 mg via ORAL
  Filled 2020-02-06 (×6): qty 1

## 2020-02-06 MED ORDER — OXYCODONE HCL 5 MG PO TABS: 5.0000 mg | ORAL_TABLET | Freq: Once | ORAL | Status: DC | PRN

## 2020-02-06 MED ORDER — TRAZODONE HCL 50 MG PO TABS: 25.0000 mg | ORAL_TABLET | Freq: Every evening | ORAL | Status: DC | PRN

## 2020-02-06 MED ORDER — METHOCARBAMOL 500 MG PO TABS: 500.0000 mg | ORAL_TABLET | Freq: Four times a day (QID) | ORAL | Status: DC | PRN

## 2020-02-06 MED ORDER — TRAMADOL HCL 50 MG PO TABS
50.0000 mg | ORAL_TABLET | Freq: Four times a day (QID) | ORAL | Status: DC
  Administered 2020-02-06 – 2020-02-10 (×8): 50 mg via ORAL
  Filled 2020-02-06 (×11): qty 1

## 2020-02-06 MED ORDER — ONDANSETRON HCL 4 MG/2ML IJ SOLN: 4.0000 mg | Freq: Four times a day (QID) | INTRAMUSCULAR | Status: DC | PRN

## 2020-02-06 MED ORDER — LACTATED RINGERS IV SOLN: INTRAVENOUS | Status: DC

## 2020-02-06 MED ORDER — CHLORHEXIDINE GLUCONATE CLOTH 2 % EX PADS
6.0000 | MEDICATED_PAD | Freq: Every day | CUTANEOUS | Status: DC
  Administered 2020-02-09 – 2020-02-10 (×2): 6 via TOPICAL

## 2020-02-06 MED ORDER — DILTIAZEM LOAD VIA INFUSION
10.0000 mg | Freq: Once | INTRAVENOUS | Status: DC
  Filled 2020-02-06: qty 10

## 2020-02-06 MED ORDER — ACETAMINOPHEN 10 MG/ML IV SOLN: 1000.0000 mg | Freq: Once | INTRAVENOUS | Status: DC | PRN

## 2020-02-06 MED ORDER — HYDROCODONE-ACETAMINOPHEN 5-325 MG PO TABS: 1.0000 | ORAL_TABLET | ORAL | Status: DC | PRN

## 2020-02-06 MED ORDER — SODIUM CHLORIDE 0.9 % IV SOLN: INTRAVENOUS | Status: DC

## 2020-02-06 MED ORDER — MAGNESIUM HYDROXIDE 400 MG/5ML PO SUSP: 30.0000 mL | Freq: Every day | ORAL | Status: DC | PRN

## 2020-02-06 MED ORDER — ACETAMINOPHEN 650 MG RE SUPP: 650.0000 mg | Freq: Four times a day (QID) | RECTAL | Status: DC | PRN

## 2020-02-06 MED ORDER — FENTANYL CITRATE (PF) 100 MCG/2ML IJ SOLN: 25.0000 ug | INTRAMUSCULAR | Status: DC | PRN

## 2020-02-06 SURGICAL SUPPLY — 60 items
BIT DRILL JC 5IN 2.0M 127 23FL (BIT) ×3 IMPLANT
BLADE SAGITTAL WIDE XTHICK NO (BLADE) ×3 IMPLANT
BLADE SURG SZ10 CARB STEEL (BLADE) ×3 IMPLANT
BNDG COHESIVE 4X5 TAN STRL (GAUZE/BANDAGES/DRESSINGS) ×3 IMPLANT
CANISTER SUCT 1200ML W/VALVE (MISCELLANEOUS) ×3 IMPLANT
CANISTER SUCT 3000ML PPV (MISCELLANEOUS) ×6 IMPLANT
COVER BACK TABLE REUSABLE LG (DRAPES) ×3 IMPLANT
COVER WAND RF STERILE (DRAPES) ×3 IMPLANT
DRAPE 3/4 80X56 (DRAPES) ×6 IMPLANT
DRAPE IMP U-DRAPE 54X76 (DRAPES) ×3 IMPLANT
DRAPE INCISE IOBAN 66X60 STRL (DRAPES) ×3 IMPLANT
DRAPE SPLIT 6X30 W/TAPE (DRAPES) ×6 IMPLANT
DRAPE SURG 17X11 SM STRL (DRAPES) ×3 IMPLANT
DRESSING AQUACEL AG SP 3.5X10 (GAUZE/BANDAGES/DRESSINGS) ×1 IMPLANT
DRSG AQUACEL AG ADV 3.5X10 (GAUZE/BANDAGES/DRESSINGS) ×3 IMPLANT
DRSG AQUACEL AG SP 3.5X10 (GAUZE/BANDAGES/DRESSINGS) ×3
DRSG OPSITE POSTOP 4X10 (GAUZE/BANDAGES/DRESSINGS) ×3 IMPLANT
DURAPREP 26ML APPLICATOR (WOUND CARE) ×15 IMPLANT
ELECT BLADE 6.5 EXT (BLADE) ×3 IMPLANT
ELECT CAUTERY BLADE 6.4 (BLADE) ×3 IMPLANT
ELECT REM PT RETURN 9FT ADLT (ELECTROSURGICAL) ×3
ELECTRODE REM PT RTRN 9FT ADLT (ELECTROSURGICAL) ×1 IMPLANT
GAUZE SPONGE 4X4 12PLY STRL (GAUZE/BANDAGES/DRESSINGS) ×3 IMPLANT
GAUZE XEROFORM 1X8 LF (GAUZE/BANDAGES/DRESSINGS) ×3 IMPLANT
GLOVE BIOGEL PI IND STRL 9 (GLOVE) ×1 IMPLANT
GLOVE BIOGEL PI INDICATOR 9 (GLOVE) ×2
GLOVE SURG 9.0 ORTHO LTXF (GLOVE) ×6 IMPLANT
GOWN STRL REUS TWL 2XL XL LVL4 (GOWN DISPOSABLE) ×3 IMPLANT
GOWN STRL REUS W/ TWL LRG LVL3 (GOWN DISPOSABLE) ×1 IMPLANT
GOWN STRL REUS W/TWL LRG LVL3 (GOWN DISPOSABLE) ×3
HEAD MODULAR ENDO (Orthopedic Implant) ×3 IMPLANT
HEAD UNPLR 41XMDLR STRL HIP (Orthopedic Implant) ×1 IMPLANT
HEMOVAC 400ML (MISCELLANEOUS) ×3
KIT DRAIN HEMOVAC JP 7FR 400ML (MISCELLANEOUS) ×1 IMPLANT
KIT TURNOVER KIT A (KITS) ×3 IMPLANT
NDL SAFETY ECLIPSE 18X1.5 (NEEDLE) ×1 IMPLANT
NEEDLE FILTER BLUNT 18X 1/2SAF (NEEDLE) ×2
NEEDLE FILTER BLUNT 18X1 1/2 (NEEDLE) ×1 IMPLANT
NEEDLE HYPO 18GX1.5 SHARP (NEEDLE) ×3
NEEDLE MAYO CATGUT SZ4 (NEEDLE) ×3 IMPLANT
NS IRRIG 1000ML POUR BTL (IV SOLUTION) ×3 IMPLANT
PACK HIP PROSTHESIS (MISCELLANEOUS) ×3 IMPLANT
PILLOW ABDUCTION FOAM SM (MISCELLANEOUS) ×3 IMPLANT
PULSAVAC PLUS IRRIG FAN TIP (DISPOSABLE) ×3
RETRIEVER SUT HEWSON (MISCELLANEOUS) ×3 IMPLANT
SLEEVE UNITRAX V40 STD (Orthopedic Implant) ×3 IMPLANT
SOL .9 NS 3000ML IRR  AL (IV SOLUTION) ×3
SOL .9 NS 3000ML IRR UROMATIC (IV SOLUTION) ×1 IMPLANT
STAPLER SKIN PROX 35W (STAPLE) ×3 IMPLANT
STEM HIP 4 127DEG (Stem) ×3 IMPLANT
SUT TICRON 2-0 30IN 311381 (SUTURE) ×15 IMPLANT
SUT VIC AB 0 CT1 36 (SUTURE) ×9 IMPLANT
SUT VIC AB 2-0 CT2 27 (SUTURE) ×9 IMPLANT
SYR 10ML LL (SYRINGE) ×3 IMPLANT
SYR BULB IRRIG 60ML STRL (SYRINGE) ×3 IMPLANT
TAPE MICROFOAM 4IN (TAPE) ×3 IMPLANT
TAPE TRANSPORE STRL 2 31045 (GAUZE/BANDAGES/DRESSINGS) ×3 IMPLANT
TIP BRUSH PULSAVAC PLUS 24.33 (MISCELLANEOUS) ×3 IMPLANT
TIP FAN IRRIG PULSAVAC PLUS (DISPOSABLE) ×1 IMPLANT
TUBE SUCT KAM VAC (TUBING) IMPLANT

## 2020-02-06 NOTE — Progress Notes (Signed)
Noted patient removed gown, tele, external, and IV upon entering room. Full linen change.

## 2020-02-06 NOTE — Plan of Care (Signed)
New care plan initiated 

## 2020-02-06 NOTE — Addendum Note (Signed)
Addendum  created 02/06/20 1708 by Molli Barrows, MD   Order list changed

## 2020-02-06 NOTE — Anesthesia Postprocedure Evaluation (Signed)
Anesthesia Post Note  Patient: Felicia Terry  Procedure(s) Performed: ARTHROPLASTY BIPOLAR HIP (HEMIARTHROPLASTY) (Left Hip)  Patient location during evaluation: PACU Anesthesia Type: Combined General/Spinal Level of consciousness: awake and confused Pain management: pain level controlled Vital Signs Assessment: post-procedure vital signs reviewed and stable Respiratory status: spontaneous breathing, respiratory function stable and patient connected to nasal cannula oxygen Cardiovascular status: blood pressure returned to baseline and stable Postop Assessment: no headache, no backache and no apparent nausea or vomiting Anesthetic complications: no   No complications documented.   Last Vitals:  Vitals:   02/06/20 1626 02/06/20 1641  BP: (!) 130/59 139/87  Pulse: 80 64  Resp: 15 17  Temp:    SpO2: 97% 95%    Last Pain:  Vitals:   02/06/20 1306  TempSrc: Temporal  PainSc: 0-No pain                 Arita Miss

## 2020-02-06 NOTE — NC FL2 (Signed)
[place] LEVEL OF CARE SCREENING TOOL     IDENTIFICATION  Patient Name: Felicia Terry Birthdate: [birth year] Sex: female Admission Date (Current Location): 02/05/2020  [place] and [place] Number:  Engineering geologist and Address:  [facility], [address] [place], [place] [address]      Provider Number: [id number]  Attending Physician Name and Address:  Tawni Millers,*  Relative Name and Phone Number:  Ara Kussmaul [phone]    Current Level of Care: Hospital Recommended Level of Care: [place] Prior Approval Number:    Date Approved/Denied:   PASRR Number: [id number]  Discharge Plan: SNF    Current Diagnoses: Patient Active Problem List   Diagnosis Date Noted   Closed left hip fracture ([place]) 02/06/2020   At high risk for falls 05/19/2016   Right knee pain 10/12/2015   Allergic rhinitis 10/12/2015   Weight loss 10/12/2015   CAD (coronary artery disease) 10/12/2015   Low back pain 10/12/2015   Shoulder pain 10/12/2015   CKD (chronic kidney disease) stage 3, GFR 30-59 ml/min 10/03/2015   Anterolisthesis 09/15/2015   Ectatic thoracic aorta ([place]) 08/21/2015   Pulmonary nodule, left 08/21/2015   Weakness 05/21/2015   Dysphagia 05/21/2015   Arteriosclerosis of coronary artery 05/15/2015   Benign essential HTN 05/15/2015   Hernia of pelvic floor 04/30/2015   Breast cancer screening 04/30/2015   Macrocytosis 04/12/2015   Senile purpura ([place]) 04/12/2015   Fatigue 04/09/2015   Hematuria, microscopic 04/09/2015   Dementia ([place]) 04/09/2015   Dyslipidemia 04/09/2015   Medication monitoring encounter 04/09/2015   Other specified hypothyroidism 04/09/2015   Essential hypertension, benign 04/09/2015   Breathlessness on exertion 10/31/2014   TI (tricuspid incompetence) 10/31/2014   MI (mitral incompetence) 10/31/2014   AI (aortic incompetence) 10/31/2014   Beat,  premature ventricular 03/22/2014   Paroxysmal atrial fibrillation ([place]) 03/02/2014    Orientation RESPIRATION BLADDER Height & Weight     Self  Normal External catheter Weight: 54.4 kg Height:  5\' 2"  (157.5 cm)  BEHAVIORAL SYMPTOMS/MOOD NEUROLOGICAL BOWEL NUTRITION STATUS      Continent Diet (regular)  AMBULATORY STATUS COMMUNICATION OF NEEDS Skin   Extensive Assist Verbally Surgical wounds                       Personal Care Assistance Level of Assistance  Bathing, Dressing Bathing Assistance: Limited assistance   Dressing Assistance: Limited assistance     Functional Limitations Info  Sight, Hearing Sight Info: Impaired Hearing Info: Impaired      SPECIAL CARE FACTORS FREQUENCY  PT (By licensed PT)     PT Frequency: 5 times per week              Contractures Contractures Info: Not present    Additional Factors Info  Code Status, Allergies Code Status Info: DNR Allergies Info: NKDA           Current Medications (02/06/2020):  This is the current hospital active medication list Current Facility-Administered Medications  Medication Dose Route Frequency Provider Last Rate Last Admin   0.9 %  sodium chloride infusion   Intravenous Continuous Mansy, Jan A, MD 100 mL/hr at 02/06/20 0400 Rate Verify at 02/06/20 0400   acetaminophen (TYLENOL) tablet 650 mg  650 mg Oral Q6H PRN Mansy, Jan A, MD       Or   acetaminophen (TYLENOL) suppository 650 mg  650 mg Rectal Q6H PRN Mansy, Arvella Merles, MD  diltiazem (CARDIZEM) tablet 30 mg  30 mg Oral BID Mansy, Jan A, MD   30 mg at 02/06/20 0314   donepezil (ARICEPT) tablet 5 mg  5 mg Oral QHS Mansy, Jan A, MD   5 mg at 02/06/20 9381   levothyroxine (SYNTHROID) tablet 25 mcg  25 mcg Oral Daily Mansy, Jan A, MD   25 mcg at 02/06/20 0175   lisinopril (ZESTRIL) tablet 2.5 mg  2.5 mg Oral Daily Mansy, Jan A, MD       magnesium hydroxide (MILK OF MAGNESIA) suspension 30 mL  30 mL Oral Daily PRN Mansy, Jan A, MD        mirtazapine (REMERON) tablet 7.5 mg  7.5 mg Oral Daily Mansy, Jan A, MD       morphine 2 MG/ML injection 2 mg  2 mg Intravenous Q4H PRN Mansy, Jan A, MD   2 mg at 02/06/20 1025   multivitamin with minerals tablet   Oral Daily Mansy, Jan A, MD       ondansetron [facility]) tablet 4 mg  4 mg Oral Q6H PRN Mansy, Jan A, MD       Or   ondansetron [facility]) injection 4 mg  4 mg Intravenous Q6H PRN Mansy, Jan A, MD       pantoprazole (PROTONIX) EC tablet 40 mg  40 mg Oral Daily Mansy, Jan A, MD       polyethylene glycol (MIRALAX / GLYCOLAX) packet 17 g  17 g Oral QODAY Mansy, Jan A, MD       pravastatin (PRAVACHOL) tablet 40 mg  40 mg Oral QHS Mansy, Jan A, MD       propranolol ER (INDERAL [place]) 24 hr capsule 240 mg  240 mg Oral BID Mansy, Jan A, MD       traZODone (DESYREL) tablet 25 mg  25 mg Oral QHS PRN Mansy, Arvella Merles, MD         Discharge Medications: Please see discharge summary for a list of discharge medications.  Relevant Imaging Results:  Relevant Lab Results:   Additional Information SS# [id number]  Su Hilt, RN

## 2020-02-06 NOTE — Progress Notes (Addendum)
PROGRESS NOTE    Felicia Terry  HQI:[id number] DOB: Nov 04, 1929 DOA: 02/05/2020 PCP: [facility]    Brief Narrative:  Patient admitted to the hospital with the working diagnosis of acute left hip fracture.  86 year old female with past medical history of coronary artery disease, hypertension, paroxysmal atrial fibrillation and dementia who presented after mechanical fall.  Apparently she fell while trying to get into the bathroom, when EMS arrived she was found on the floor, with evident pain, unable to stand back on her feet.  On her initial physical examination blood pressure 156/77, heart rate 61, temperature 98.8, respiratory 20, oxygen saturation 96%. Her lungs are clear to auscultation bilaterally, heart S1-S2, present rhythmic, abdomen soft, no lower extremity edema, positive left hip tenderness. Sodium 137, potassium 3.6, chloride 102, bicarb 24, glucose 106, BUN 21, creatinine 0.79, white count 9.0, hemoglobin 9.7, hematocrit 35.3, platelets 189.  SARS COVID-19 was negative.  Urinalysis specific gravity 1.012, 6-10 red cells, 0-5 white cells.  Head and cervical spine no acute changes.  Chest radiograph no infiltrates.  EKG 70 bpm, normal axis, normal intervals, sinus rhythm, poor R wave progression, no ST segment or T wave changes  Left hip films with displaced left transcervical femoral neck    Assessment & Plan:   Principal Problem:   Closed left hip fracture (HCC) Active Problems:   Dementia (HCC)   Dyslipidemia   Paroxysmal atrial fibrillation (HCC)   Essential hypertension   CKD stage G2/A2, GFR 60-89 and albumin creatinine ratio 30-299 mg/g   1. Acute left hip fracture. Plan for surgical intervention this am, continue pain control with acetaminophen, morphine.  Continue with dvt prophylaxis, follow with orthopedics, PT and OT recommendations.   2. Paroxysmal atrial fibrillation. Rate controlled, continue diltiazem for rate control, patient is not on  anticoagulation.   3. HTN. Continue blood pressure control with lisinopril. Blood pressure this am 153/57 mmHg.   4.  Dementia. Patient confused but not agitated, continue neuro checks per unit protocol. On Aricept and Risperdal.  5. GERD. Continue proton pump inhibitors.   6. CKD stage 2. Stable renal function and electrolytes, will avoid hypotension and nephrotoxic medications.  Patient continue to be at high risk for complications related to acute hip fracture.   Status is: Inpatient  Remains inpatient appropriate because:Inpatient level of care appropriate due to severity of illness   Dispo: The patient is from: Home              Anticipated d/c is to: SNF              Anticipated d/c date is: 3 days              Patient currently is not medically stable to d/c.   DVT prophylaxis: Enoxaparin   Code Status:   dnr   Family Communication:  No family at the bedside       Consultants:   Orthopedics       Subjective: Patient continue to left hip pain, moderate in intensity, worse with movement, no radiation, no nausea or vomiting.   Objective: Vitals:   02/06/20 0307 02/06/20 0723 02/06/20 1122 02/06/20 1306  BP: (!) 159/64 (!) 144/55 (!) 148/79 (!) 153/57  Pulse: 71 69 72 66  Resp: 18 16 18 17   Temp: 98.3 F (36.8 C) 98.2 F (36.8 C) 98.2 F (36.8 C) 98.4 F (36.9 C)  TempSrc: Oral Oral Oral Temporal  SpO2: 94% 95% 98% 94%  Weight:  54.4 kg  Height:    5\' 2"  (1.575 m)    Intake/Output Summary (Last 24 hours) at 02/06/2020 1400 Last data filed at 02/06/2020 0400 Gross per 24 hour  Intake 126.45 ml  Output 1 ml  Net 125.45 ml   Filed Weights   02/05/20 2300 02/06/20 1306  Weight: 54.4 kg 54.4 kg    Examination:   General: Not in pain or dyspnea, deconditioned  Neurology: Awake and alert, non focal, confused and disorientated, not agitated.   E ENT: mild pallor, no icterus, oral mucosa moist Cardiovascular: No JVD. S1-S2 present, rhythmic, no  gallops, rubs, or murmurs. No lower extremity edema. Pulmonary: positive breath sounds bilaterally, adequate air movement, no wheezing, rhonchi or rales. Gastrointestinal. Abdomen soft and non tender Skin. No rashes Musculoskeletal: no joint deformities     Data Reviewed: I have personally reviewed following labs and imaging studies  CBC: Recent Labs  Lab 02/06/20 0046 02/06/20 0447  WBC 9.0 11.4*  HGB 11.7* 11.4*  HCT 35.3* 34.5*  MCV 100.0 99.4  PLT 189 035   Basic Metabolic Panel: Recent Labs  Lab 02/06/20 0046 02/06/20 0447  NA 137 139  K 3.6 3.8  CL 102 105  CO2 24 25  GLUCOSE 106* 117*  BUN 21 21  CREATININE 0.79 0.76  CALCIUM 8.7* 8.3*   GFR: Estimated Creatinine Clearance: 38.4 mL/min (by C-G formula based on SCr of 0.76 mg/dL). Liver Function Tests: Recent Labs  Lab 02/06/20 0046  AST 27  ALT 17  ALKPHOS 71  BILITOT 0.9  PROT 6.8  ALBUMIN 3.7   No results for input(s): LIPASE, AMYLASE in the last 168 hours. No results for input(s): AMMONIA in the last 168 hours. Coagulation Profile: Recent Labs  Lab 02/06/20 0046  INR 1.0   Cardiac Enzymes: No results for input(s): CKTOTAL, CKMB, CKMBINDEX, TROPONINI in the last 168 hours. BNP (last 3 results) No results for input(s): PROBNP in the last 8760 hours. HbA1C: No results for input(s): HGBA1C in the last 72 hours. CBG: No results for input(s): GLUCAP in the last 168 hours. Lipid Profile: No results for input(s): CHOL, HDL, LDLCALC, TRIG, CHOLHDL, LDLDIRECT in the last 72 hours. Thyroid Function Tests: No results for input(s): TSH, T4TOTAL, FREET4, T3FREE, THYROIDAB in the last 72 hours. Anemia Panel: No results for input(s): VITAMINB12, FOLATE, FERRITIN, TIBC, IRON, RETICCTPCT in the last 72 hours.    Radiology Studies: I have reviewed all of the imaging during this hospital visit personally     Scheduled Meds: . [MAR Hold] diltiazem  30 mg Oral BID  . [MAR Hold] donepezil  5 mg  Oral QHS  . [MAR Hold] levothyroxine  25 mcg Oral Daily  . [MAR Hold] lisinopril  2.5 mg Oral Daily  . [MAR Hold] mirtazapine  7.5 mg Oral Daily  . [MAR Hold] multivitamin with minerals   Oral Daily  . [MAR Hold] pantoprazole  40 mg Oral Daily  . [MAR Hold] polyethylene glycol  17 g Oral QODAY  . [MAR Hold] pravastatin  40 mg Oral QHS  . [MAR Hold] propranolol ER  240 mg Oral BID   Continuous Infusions: . sodium chloride 100 mL/hr at 02/06/20 0400  . [MAR Hold]  ceFAZolin (ANCEF) IV       LOS: 0 days        Yomar Mejorado Gerome Apley, MD

## 2020-02-06 NOTE — ED Notes (Addendum)
This pt in and out cathed by April, RN with this RN bedside. Urine obtained and sent to lab.

## 2020-02-06 NOTE — H&P (Signed)
[place]   PATIENT NAME: Felicia Terry    MR#:  [id number]  DATE OF BIRTH:  1930/05/29  DATE OF ADMISSION:  02/05/2020  PRIMARY CARE PHYSICIAN: [facility]   REQUESTING/REFERRING PHYSICIAN: Gonzella Lex, MD  CHIEF COMPLAINT:   Chief Complaint  Patient presents with  . Fall    HISTORY OF PRESENT ILLNESS:  Felicia Terry  is a 86 y.o. pleasantly demented Caucasian female with a known history of coronary artery disease, hypertension, paroxysmal atrial fibrillation on no anticoagulation and dementia, who presented to the emergency room with acute onset of left hip pain after having unwitnessed fall at her skilled nursing facility.  Per EMS the patient was found on the floor outside in her bathroom.  She was also complaining of left leg pain and was holding it in crying in the ER.  No reported chest pain or dyspnea or cough or wheezing.  No reported fever or chills.  No headache or dizziness or blurred vision.  No presyncope or syncope were reported.  Upon presentation to the emergency room, blood pressure was 156/77 with otherwise normal vital signs.  Labs revealed potassium of 3.6 and BUN of 24 with otherwise unremarkable CMP and CBC.  Noncontrasted head CT scan and C-spine CT scan as mentioned below with no acute findings.  Left hip x-ray as below revealed a displaced left femoral neck fracture.  Chest x-ray showed marked severity calcification of the aortic arch and degenerative changes in both shoulders and thoracic spine with no acute cardiopulmonary disease. EKG showed sinus rhythm with rate of 70 with poor R wave progression and slightly prolonged PR interval.  The patient will be admitted to a medically monitored bed for further evaluation and management. PAST MEDICAL HISTORY:   Past Medical History:  Diagnosis Date  . Coronary artery disease    cardiac stent  . Hypertension   . Reynolds syndrome [facility])     PAST SURGICAL HISTORY:   Past  Surgical History:  Procedure Laterality Date  . ABDOMINAL HYSTERECTOMY      SOCIAL HISTORY:   Social History   Tobacco Use  . Smoking status: Never Smoker  . Smokeless tobacco: Never Used  Substance Use Topics  . Alcohol use: No    FAMILY HISTORY:   Family History  Problem Relation Age of Onset  . Diabetes Mother   . Diabetes Son   . Cancer Neg Hx   . Heart disease Neg Hx   . Stroke Neg Hx     DRUG ALLERGIES:  No Known Allergies  REVIEW OF SYSTEMS:   ROS As per history of present illness, otherwise unobtainable due to the patient's dementia. MEDICATIONS AT HOME:   Prior to Admission medications   Medication Sig Start Date End Date Taking? Authorizing Provider  acetaminophen (TYLENOL ARTHRITIS PAIN) 650 MG CR tablet Take 650 mg by mouth every 8 (eight) hours as needed for pain.    [provider]  aspirin EC 81 MG tablet Take 81 mg by mouth daily.    [provider]  cetaphil (CETAPHIL) lotion Apply 1 application topically as needed for dry skin.    [provider]  cromolyn (OPTICROM) 4 % ophthalmic solution Place 1 drop into the left eye 4 (four) times daily. Patient not taking: Reported on 05/19/2016 04/07/16   [name], NP  diltiazem (CARDIZEM) 30 MG tablet Take 1 tablet by mouth 2 (two) times daily. 05/16/15   [provider]  donepezil (ARICEPT)  5 MG tablet Take 1 tablet (5 mg total) by mouth at bedtime. Patient not taking: Reported on 03/10/2017 04/07/16   [name], NP  esomeprazole (NEXIUM) 40 MG capsule Take 40 mg by mouth daily.    [provider]  ipratropium (ATROVENT) 0.03 % nasal spray Place 2 sprays into both nostrils every 12 (twelve) hours. Patient not taking: Reported on 05/19/2016 10/12/15   [name], NP  levothyroxine (SYNTHROID, LEVOTHROID) 25 MCG tablet Take 1 tablet (25 mcg total) by mouth daily. Patient taking differently: Take 25 mcg by mouth daily.  03/28/16   [name], NP    lisinopril (PRINIVIL,ZESTRIL) 2.5 MG tablet Take 1 tablet (2.5 mg total) by mouth daily. 04/07/16   [name], NP  meloxicam (MOBIC) 7.5 MG tablet Take 7.5 mg by mouth daily. 03/09/17   [provider]  mirtazapine (REMERON) 7.5 MG tablet Take 1 tablet by mouth daily. 01/13/17   [provider]  Multiple Vitamin (MULTI-VITAMINS) TABS Take by mouth daily.    [provider]  neomycin-polymyxin b-dexamethasone (MAXITROL) 3.5-10000-0.1 SUSP Place 1 drop into both eyes as needed. 01/15/17   [provider]  polyethylene glycol (MIRALAX / GLYCOLAX) packet Take 17 g by mouth every other day.    [provider]  pravastatin (PRAVACHOL) 40 MG tablet Take 40 mg by mouth at bedtime.    [provider]  propranolol (INNOPRAN XL) 120 MG 24 hr capsule Take 240 mg by mouth 2 (two) times daily.    [provider]  triamcinolone cream (KENALOG) 0.1 % Apply 1 application topically 2 (two) times daily. Too strong for face Patient not taking: Reported on 05/19/2016 08/31/15   Arnetha Courser, MD      VITAL SIGNS:  Blood pressure (!) 156/77, pulse 61, temperature 98.8 F (37.1 C), temperature source Oral, resp. rate 20, height 5\' 2"  (1.575 m), weight 54.4 kg, SpO2 96 %.  PHYSICAL EXAMINATION:  Physical Exam  GENERAL:  86 y.o.-year-old pleasantly demented Caucasian female patient lying in the bed and quite anxious. EYES: Pupils equal, round, reactive to light and accommodation. No scleral icterus. Extraocular muscles intact.  HEENT: Head atraumatic, normocephalic. Oropharynx and nasopharynx clear.  NECK:  Supple, no jugular venous distention. No thyroid enlargement, no tenderness.  LUNGS: Normal breath sounds bilaterally, no wheezing, rales,rhonchi or crepitation. No use of accessory muscles of respiration.  CARDIOVASCULAR: Regular rate and rhythm, S1, S2 normal. No murmurs, rubs, or gallops.  ABDOMEN: Soft, nondistended, nontender. Bowel sounds  present. No organomegaly or mass.  EXTREMITIES: No pedal edema, cyanosis, or clubbing. Musculoskeletal: Left hip tenderness NEUROLOGIC: Cranial nerves II through XII are intact. Muscle strength 5/5 in all extremities. Sensation intact. Gait not checked.  PSYCHIATRIC: The patient is alert and oriented x 3.  Normal affect and good eye contact. SKIN: No obvious rash, lesion, or ulcer.   LABORATORY PANEL:   CBC No results for input(s): WBC, HGB, HCT, PLT in the last 168 hours. ------------------------------------------------------------------------------------------------------------------  Chemistries  No results for input(s): NA, K, CL, CO2, GLUCOSE, BUN, CREATININE, CALCIUM, MG, AST, ALT, ALKPHOS, BILITOT in the last 168 hours.  Invalid input(s): GFRCGP ------------------------------------------------------------------------------------------------------------------  Cardiac Enzymes No results for input(s): TROPONINI in the last 168 hours. ------------------------------------------------------------------------------------------------------------------  RADIOLOGY:  DG Chest 1 View  Result Date: 02/06/2020 CLINICAL DATA:  Status post fall.  Preoperative evaluation. EXAM: CHEST  1 VIEW COMPARISON:  February 09, 2018 FINDINGS: There is no evidence of acute infiltrate, pleural effusion or pneumothorax. The heart size and  mediastinal contours are within normal limits. There is marked severity calcification of the aortic arch. Degenerative changes seen involving the bilateral shoulders and thoracic spine. IMPRESSION: No active disease. Electronically Signed   By: Virgina Norfolk M.D.   On: 02/06/2020 00:03   CT Head Wo Contrast  Result Date: 02/06/2020 CLINICAL DATA:  Fall EXAM: CT HEAD WITHOUT CONTRAST TECHNIQUE: Contiguous axial images were obtained from the base of the skull through the vertex without intravenous contrast. COMPARISON:  None. FINDINGS: Brain: No evidence of acute  territorial infarction, hemorrhage, hydrocephalus,extra-axial collection or mass lesion/mass effect. There is dilatation the ventricles and sulci consistent with age-related atrophy. Low-attenuation changes in the deep white matter consistent with small vessel ischemia. Vascular: No hyperdense vessel or unexpected calcification. Skull: The skull is intact. No fracture or focal lesion identified. Sinuses/Orbits: The visualized paranasal sinuses and mastoid air cells are clear. The orbits and globes intact. Other: None Cervical spine: Alignment: There is a grade 1 anterolisthesis C3-C4 measuring 3 mm C4-C5 measuring 4 mm which is unchanged since 2014. Skull base and vertebrae: Visualized skull base is intact. No atlanto-occipital dissociation. The vertebral body heights are well maintained. No fracture or pathologic osseous lesion seen. Soft tissues and spinal canal: The visualized paraspinal soft tissues are unremarkable. No prevertebral soft tissue swelling is seen. The spinal canal is grossly unremarkable, no large epidural collection or significant canal narrowing. Disc levels: Multilevel spine spondylosis with disc osteophyte uncovertebral osteophytes most notable C5-C6 severe neural foraminal narrowing and moderate central canal stenosis. Upper chest: Biapical scarring is seen. Thoracic inlet is within normal limits. Other: None IMPRESSION: No acute intracranial abnormality. Findings consistent with age related atrophy and chronic small vessel ischemia No acute fracture or malalignment of the spine. Electronically Signed   By: Prudencio Pair M.D.   On: 02/06/2020 00:12   CT Cervical Spine Wo Contrast  Result Date: 02/06/2020 CLINICAL DATA:  Fall EXAM: CT HEAD WITHOUT CONTRAST TECHNIQUE: Contiguous axial images were obtained from the base of the skull through the vertex without intravenous contrast. COMPARISON:  None. FINDINGS: Brain: No evidence of acute territorial infarction, hemorrhage,  hydrocephalus,extra-axial collection or mass lesion/mass effect. There is dilatation the ventricles and sulci consistent with age-related atrophy. Low-attenuation changes in the deep white matter consistent with small vessel ischemia. Vascular: No hyperdense vessel or unexpected calcification. Skull: The skull is intact. No fracture or focal lesion identified. Sinuses/Orbits: The visualized paranasal sinuses and mastoid air cells are clear. The orbits and globes intact. Other: None Cervical spine: Alignment: There is a grade 1 anterolisthesis C3-C4 measuring 3 mm C4-C5 measuring 4 mm which is unchanged since 2014. Skull base and vertebrae: Visualized skull base is intact. No atlanto-occipital dissociation. The vertebral body heights are well maintained. No fracture or pathologic osseous lesion seen. Soft tissues and spinal canal: The visualized paraspinal soft tissues are unremarkable. No prevertebral soft tissue swelling is seen. The spinal canal is grossly unremarkable, no large epidural collection or significant canal narrowing. Disc levels: Multilevel spine spondylosis with disc osteophyte uncovertebral osteophytes most notable C5-C6 severe neural foraminal narrowing and moderate central canal stenosis. Upper chest: Biapical scarring is seen. Thoracic inlet is within normal limits. Other: None IMPRESSION: No acute intracranial abnormality. Findings consistent with age related atrophy and chronic small vessel ischemia No acute fracture or malalignment of the spine. Electronically Signed   By: Prudencio Pair M.D.   On: 02/06/2020 00:12   DG Hip Unilat W or Wo Pelvis 2-3 Views Left  Result Date: 02/06/2020  CLINICAL DATA:  Fall and pain EXAM: DG HIP (WITH OR WITHOUT PELVIS) 2-3V LEFT COMPARISON:  None. FINDINGS: There is a displaced transcervical femoral neck fracture of the left hip. There is superior displacement of the femoral shaft. The femoral head is rotated however still within the acetabulum. There is  diffuse osteopenia noted. Overlying soft tissue swelling. IMPRESSION: Displaced left transcervical femoral neck fracture. Electronically Signed   By: Prudencio Pair M.D.   On: 02/06/2020 00:06      IMPRESSION AND PLAN:   1.  Left hip fracture status post likely mechanical fall. -The patient will be admitted to a medically monitored bed. -Pain management will be provided. -She will be kept n.p.o. after midnight. -Orthopedic consultation will be obtained. -Dr. Mack Guise was notified and is aware about the patient. -Her aspirin will be held off.  2.  Paroxysmal atrial fibrillation. -Her Cardizem will be continued. -She is not on anticoagulation probably secondary to fall risk.  3.  Hypertension. -Her Toprol-XL and lisinopril will be continued as well as Cardizem.  4.  Dementia with behavioral changes. -Continue Aricept and Risperdal.  5.  Hypothyroidism. We will continue Synthroid and check TSH level.  6.  GERD. -We will continue PPI therapy.  7.  DVT prophylaxis. -SCDs. -Medical prophylaxis is currently being held off to postoperative time.  All the records are reviewed and case discussed with ED provider. The plan of care was discussed in details with the patient (and family). I answered all questions. The patient agreed to proceed with the above mentioned plan. Further management will depend upon hospital course.   CODE STATUS: Full code  Status is: Inpatient  Remains inpatient appropriate because:Ongoing active pain requiring inpatient pain management, Ongoing diagnostic testing needed not appropriate for outpatient work up, Unsafe d/c plan, IV treatments appropriate due to intensity of illness or inability to take PO and Inpatient level of care appropriate due to severity of illness   Dispo: The patient is from: SNF              Anticipated d/c is to: SNF              Anticipated d/c date is: > 3 days              Patient currently is not medically stable to  d/c.   TOTAL TIME TAKING CARE OF THIS PATIENT: 55 minutes.    Christel Mormon M.D on 02/06/2020 at 12:51 AM  [facility]   From 7 PM-7 AM, contact night-coverage [email or web address]  CC: Primary care physician; Shari Prows, [facility]   Note: This dictation was prepared with Dragon dictation along with smaller phrase technology. Any transcriptional typo errors that result from this process are unintentional.

## 2020-02-06 NOTE — Anesthesia Preprocedure Evaluation (Signed)
Anesthesia Evaluation  Patient identified by MRN, date of birth, ID band Patient awake    Reviewed: Allergy & Precautions, NPO status , Patient's Chart, lab work & pertinent test results  History of Anesthesia Complications Negative for: history of anesthetic complications  Airway Mallampati: III  TM Distance: >3 FB Neck ROM: Full    Dental  (+) Teeth Intact   Pulmonary neg pulmonary ROS, neg sleep apnea, neg COPD, Patient abstained from smoking.Not current smoker,    Pulmonary exam normal breath sounds clear to auscultation       Cardiovascular Exercise Tolerance: Poor METShypertension, + CAD and + Peripheral Vascular Disease  (-) Past MI (-) dysrhythmias  Rhythm:Regular Rate:Normal - Systolic murmurs TTE 9798: INTERPRETATION  NORMAL LEFT VENTRICULAR SYSTOLIC FUNCTION  WITH MILD LVH  NORMAL RIGHT VENTRICULAR SYSTOLIC FUNCTION  MILD VALVULAR REGURGITATION (tricuspid, mitral, aortic)  NO VALVULAR STENOSIS  MILD PHTN  EF 45-50%     Neuro/Psych PSYCHIATRIC DISORDERS Dementia negative neurological ROS     GI/Hepatic neg GERD  ,(+)     (-) substance abuse  ,   Endo/Other  neg diabetesHypothyroidism   Renal/GU CRFRenal diseasenegative Renal ROS     Musculoskeletal   Abdominal   Peds  Hematology   Anesthesia Other Findings Past Medical History: No date: Coronary artery disease     Comment:  cardiac stent No date: Hypertension No date: Reynolds syndrome [facility])  Reproductive/Obstetrics                             Anesthesia Physical Anesthesia Plan  ASA: III  Anesthesia Plan: General/Spinal   Post-op Pain Management:    Induction: Intravenous  PONV Risk Score and Plan: 3 and Ondansetron, Dexamethasone, Propofol infusion and TIVA  Airway Management Planned: Natural Airway  Additional Equipment: None  Intra-op Plan:   Post-operative Plan:   Informed Consent: I have  reviewed the patients History and Physical, chart, labs and discussed the procedure including the risks, benefits and alternatives for the proposed anesthesia with the patient or authorized representative who has indicated his/her understanding and acceptance.   Patient has DNR.  Discussed DNR with power of attorney and Suspend DNR.   Dental advisory given, History available from chart only and Consent reviewed with [place] Discussed with: CRNA and Surgeon  Anesthesia Plan Comments: (Discussed R/B/A of neuraxial anesthesia technique with son Chenoa Luddy  (patient has dementia and husband has parkinson dementia): - rare risks of spinal/epidural hematoma, nerve damage, infection - Risk of PDPH - Risk of nausea and vomiting - Risk of conversion to general anesthesia and its associated risks, including sore throat, damage to lips/teeth/oropharynx, and rare risks such as cardiac and respiratory events. Son voiced understanding. We spoke in detail regarding patient's DNR status. Son said he believes it is in line with mother's wishes to suspend DNR perioperatively, then resume DNR afterward.)        Anesthesia Quick Evaluation

## 2020-02-06 NOTE — Op Note (Signed)
02/06/2020  4:20 PM  PATIENT:  Felicia Terry   MRN: [id number]  PRE-OPERATIVE DIAGNOSIS:  Left displaced femoral neck hip Fracture  POST-OPERATIVE DIAGNOSIS:  Same  PROCEDURE:  Procedure(s): LEFT HIP HEMIARTHROPLASTY   PREOPERATIVE INDICATIONS:    Felicia Terry is an 86 y.o. female who was admitted with a diagnosis of Left displaced femoral neck hip Fracture.  I have recommended surgical fixation with hemiarthroplasty for this injury. I have explained the surgery and the postoperative course to the patient's son who agreed with surgical management of this fracture.    The risks benefits and alternatives were discussed with the patient and their family including but not limited to the risks of  infection requiring removal of the prosthesis, bleeding requiring blood transfusion, nerve injury especially to the sciatic nerve leading to foot drop or lower extremity numbness, periprosthetic fracture, dislocation leg length discrepancy, change in lower extremity rotation persistent hip pain, loosening or failure of the components and the need for revision surgery. Medical risks include but are not limited to DVT and pulmonary embolism, myocardial infarction, stroke, pneumonia, respiratory failure and death.  OPERATIVE REPORT     SURGEON:  Thornton Park, MD    ASSISTANT:  Surgical tech    ANESTHESIA:  spinal    COMPLICATIONS:  None.   SPECIMEN: Femoral head to pathology    COMPONENTS:  St[employer]ker Accolade 4 femoral component size 51 with a standard neck sleeve.    PROCEDURE IN DETAIL:   The patient was met in the holding area and identified.  Patient son was at the bedside.  The left hip was identified and marked at the operative site after verbally confirming with the patient that this was the correct site of surgery.  The patient was then transported to the OR  and  underwent spinal anesthesia.  The patient was then placed in the lateral decubitus position with the operative side  up and secured on the operating room table with a pegboard and all bony prominences were adequately padded. This included an axillary roll and additional padding around the nonoperative leg to prevent compression to the common peroneal nerve.    The operative lower extremity was prepped and draped in a sterile fashion.  A time out was performed prior to incision to verify patient's name, date of birth, medical record number, correct site of surgery correct procedure to be performed. The timeout was also used to verify the patient received antibiotics now appropriate instruments, implants and radiographic studies were available in the room. Once all in attendance were in agreement case began.    A posterolateral approach was utilized via sharp dissection  carried down to the subcutaneous tissue.  Bleeding vessels were coagulated using electrocautery.  The fascia lata was identified and incised along the length of the skin incision.  The gluteus maximus muscle was then split in line with its fibers. Self-retaining retractors were  inserted.  With the hip internally rotated, the short external rotators  were identified and removed from the posterior attachment from the greater trochanter.  The capsule was identified and a T-shaped capsulotomy was performed. The capsule was tagged with #2 Tycron for later repair.  The femoral neck fracture was exposed, and the femoral head was removed using a corkscrew device. This was measured to be 51 mm in diameter. The attention was then turned to proximal femur preparation.  An oscillating saw was used to perform a proximal femoral osteotomy 1 fingerbreadth above the lesser trochanter. The trial 51  mm femoral head was placed into the acetabulum and had an excellent suction fit. The attention was then turned back to femoral preparation.    A femoral skid and Cobra retractor were placed under the femoral neck to allow for adequate visualization. A box osteotome was used to  make the initial entry into the proximal femur. A single hand reamer was used to prepare the femoral canal. A T-shaped femoral canal sounder was then used to ensure no penetration femoral cortex had occurred during reaming. The proximal femur was then sequentially broached by hand. A size 4 femoral trial broach was found to have best medial to lateral canal fit. Once adequate mediolateral canal fill was achieved the trial femoral broach, neck, and head was assembled and the hip was reduced. It was found to have excellent stability, equivalent leg lengths with functional range of motion. The trial components were then removed.  I copiously irrigated the femoral canal and then impacted the real femoral prosthesis into place into the appropriate version, slightly anteverted to the normal anatomy, and I impacted the actual 51 mm Unitrax femoral component with a standard neck sleeve into place. The hip was then reduced and taken through functional range of motion and found to have excellent stability. Leg lengths were restored. The hip joint was copiously irrigated.   A soft tissue repair of the capsule and external rotators was performed using #2 Tycron Excellent posterior capsular repair was achieved.  The external rotators were repaired to the greater trochanter via drill holes and #2 Tycron.  The fascia lata was then closed with interrupted 0 Vicryl suture. The subcutaneous tissues were closed with 2-0 Vicryl and the skin approximated with staples.   The patient was then placed supine on the operative table. Leg lengths were checked clinically and found to be equivalent. An abduction pillow was placed between the lower extremities. The patient was then transferred to a hospital bed and brought to the PACU in stable condition. I was scrubbed and present the entire case and all sharp and instrument counts were correct at the conclusion of the case. I spoke with the patient's son by phone from the PACU  to let him  know the case was completed without complication patient was stable in recovery room.   Felicia Gaul, MD Orthopedic Surgeon

## 2020-02-06 NOTE — Transfer of Care (Signed)
Immediate Anesthesia Transfer of Care Note  Patient: Felicia Terry  Procedure(s) Performed: ARTHROPLASTY BIPOLAR HIP (HEMIARTHROPLASTY) (Left Hip)  Patient Location: PACU  Anesthesia Type:Spinal  Level of Consciousness: awake and sedated  Airway & Oxygen Therapy: Patient Spontanous Breathing and Patient connected to face mask oxygen  Post-op Assessment: Report given to RN and Post -op Vital signs reviewed and stable  Post vital signs: Reviewed and stable  Last Vitals:  Vitals Value Taken Time  BP 114/51 02/06/20 1556  Temp 36.1 C 02/06/20 1556  Pulse 64 02/06/20 1600  Resp 17 02/06/20 1602  SpO2 99 % 02/06/20 1600  Vitals shown include unvalidated device data.  Last Pain:  Vitals:   02/06/20 1306  TempSrc: Temporal  PainSc: 0-No pain         Complications: No complications documented.

## 2020-02-06 NOTE — TOC Progression Note (Signed)
Transition of Care [facility]) - Progression Note    Patient Details  Name: Felicia Terry MRN: [id number] Date of Birth: [birth year]  Transition of Care [facility]) CM/SW [place], RN Phone Number: 02/06/2020, 9:23 AM  Clinical Narrative:   Marzella Schlein and PASSR completed anticipating the need for SNF, Patient lives at [facility]         Expected Discharge Plan and Services                                                 Social Determinants of Health (SDOH) Interventions    Readmission Risk Interventions No flowsheet data found.

## 2020-02-06 NOTE — Anesthesia Procedure Notes (Signed)
Date/Time: 02/06/2020 1:40 PM Performed by: Nelda Marseille, CRNA Pre-anesthesia Checklist: Patient identified, Emergency Drugs available, Suction available, Patient being monitored and Timeout performed Oxygen Delivery Method: Simple face mask

## 2020-02-06 NOTE — Anesthesia Procedure Notes (Addendum)
Spinal  Patient location during procedure: OR Start time: 02/06/2020 1:45 PM End time: 02/06/2020 1:58 PM Staffing Performed: anesthesiologist  Anesthesiologist: Arita Miss, MD Preanesthetic Checklist Completed: patient identified, IV checked, site marked, risks and benefits discussed, surgical consent, monitors and equipment checked, pre-op evaluation and timeout performed Spinal Block Patient position: sitting Prep: ChloraPrep Patient monitoring: heart rate, continuous pulse ox, blood pressure and cardiac monitor Approach: midline Location: L2-3 Injection technique: single-shot Needle Needle type: Quincke  Needle gauge: 22 G Needle length: 9 cm Assessment Sensory level: T10 Additional Notes Negative paresthesia. Negative blood return. Positive free-flowing CSF. Expiration date of kit checked and confirmed. Patient tolerated procedure well, without complications.

## 2020-02-06 NOTE — Consult Note (Signed)
ORTHOPAEDIC CONSULTATION  REQUESTING PHYSICIAN: Arrien, Jimmy Picket,*  Chief Complaint: Left hip pain status post fall  HPI: Felicia Terry is a 86 y.o. female with dementia who was brought to the [facility] emergency department overnight after a fall.  X-rays revealed a displaced left femoral neck hip fracture.  Patient is seen in her hospital room today with her son at the bedside.  Patient is unable to provide an accurate history due to her dementia.  She is in no acute distress.  Past Medical History:  Diagnosis Date  . Coronary artery disease    cardiac stent  . Hypertension   . Reynolds syndrome [facility])    Past Surgical History:  Procedure Laterality Date  . ABDOMINAL HYSTERECTOMY     Social History   Socioeconomic History  . Marital status: Married    Spouse name: Not on file  . Number of children: Not on file  . Years of education: Not on file  . Highest education level: Not on file  Occupational History  . Not on file  Tobacco Use  . Smoking status: Never Smoker  . Smokeless tobacco: Never Used  Substance and Sexual Activity  . Alcohol use: No  . Drug use: No  . Sexual activity: Not on file  Other Topics Concern  . Not on file  Social History Narrative  . Not on file   Social Determinants of Health   Financial Resource Strain:   . Difficulty of Paying Living Expenses: Not on file  Food Insecurity:   . Worried About Charity fundraiser in the Last Year: Not on file  . Ran Out of Food in the Last Year: Not on file  Transportation Needs:   . Lack of Transportation (Medical): Not on file  . Lack of Transportation (Non-Medical): Not on file  Physical Activity:   . Days of Exercise per Week: Not on file  . Minutes of Exercise per Session: Not on file  Stress:   . Feeling of Stress : Not on file  Social Connections:   . Frequency of Communication with Friends and Family: Not on file  . Frequency of Social Gatherings with Friends and Family: Not  on file  . Attends Religious Services: Not on file  . Active Member of Clubs or Organizations: Not on file  . Attends Archivist Meetings: Not on file  . Marital Status: Not on file   Family History  Problem Relation Age of Onset  . Diabetes Mother   . Diabetes Son   . Cancer Neg Hx   . Heart disease Neg Hx   . Stroke Neg Hx    No Known Allergies Prior to Admission medications   Medication Sig Start Date End Date Taking? Authorizing Provider  acetaminophen (TYLENOL ARTHRITIS PAIN) 650 MG CR tablet Take 650 mg by mouth every 8 (eight) hours as needed for pain.   Yes [provider]  mirtazapine (REMERON) 7.5 MG tablet Take 1 tablet by mouth daily. 01/13/17  Yes [provider]  NON FORMULARY    Yes [provider]  pravastatin (PRAVACHOL) 40 MG tablet Take 40 mg by mouth at bedtime.   Yes [provider]  risperiDONE (RISPERDAL) 0.25 MG tablet Take 0.25 mg by mouth at bedtime.   Yes [provider]  traZODone (DESYREL) 50 MG tablet Take 50 mg by mouth at bedtime as needed for sleep.   Yes [provider]   DG Chest 1 View  Result Date:  02/06/2020 CLINICAL DATA:  Status post fall.  Preoperative evaluation. EXAM: CHEST  1 VIEW COMPARISON:  February 09, 2018 FINDINGS: There is no evidence of acute infiltrate, pleural effusion or pneumothorax. The heart size and mediastinal contours are within normal limits. There is marked severity calcification of the aortic arch. Degenerative changes seen involving the bilateral shoulders and thoracic spine. IMPRESSION: No active disease. Electronically Signed   By: Virgina Norfolk M.D.   On: 02/06/2020 00:03   CT Head Wo Contrast  Result Date: 02/06/2020 CLINICAL DATA:  Fall EXAM: CT HEAD WITHOUT CONTRAST TECHNIQUE: Contiguous axial images were obtained from the base of the skull through the vertex without intravenous contrast. COMPARISON:  None. FINDINGS: Brain: No evidence of acute  territorial infarction, hemorrhage, hydrocephalus,extra-axial collection or mass lesion/mass effect. There is dilatation the ventricles and sulci consistent with age-related atrophy. Low-attenuation changes in the deep white matter consistent with small vessel ischemia. Vascular: No hyperdense vessel or unexpected calcification. Skull: The skull is intact. No fracture or focal lesion identified. Sinuses/Orbits: The visualized paranasal sinuses and mastoid air cells are clear. The orbits and globes intact. Other: None Cervical spine: Alignment: There is a grade 1 anterolisthesis C3-C4 measuring 3 mm C4-C5 measuring 4 mm which is unchanged since 2014. Skull base and vertebrae: Visualized skull base is intact. No atlanto-occipital dissociation. The vertebral body heights are well maintained. No fracture or pathologic osseous lesion seen. Soft tissues and spinal canal: The visualized paraspinal soft tissues are unremarkable. No prevertebral soft tissue swelling is seen. The spinal canal is grossly unremarkable, no large epidural collection or significant canal narrowing. Disc levels: Multilevel spine spondylosis with disc osteophyte uncovertebral osteophytes most notable C5-C6 severe neural foraminal narrowing and moderate central canal stenosis. Upper chest: Biapical scarring is seen. Thoracic inlet is within normal limits. Other: None IMPRESSION: No acute intracranial abnormality. Findings consistent with age related atrophy and chronic small vessel ischemia No acute fracture or malalignment of the spine. Electronically Signed   By: Prudencio Pair M.D.   On: 02/06/2020 00:12   CT Cervical Spine Wo Contrast  Result Date: 02/06/2020 CLINICAL DATA:  Fall EXAM: CT HEAD WITHOUT CONTRAST TECHNIQUE: Contiguous axial images were obtained from the base of the skull through the vertex without intravenous contrast. COMPARISON:  None. FINDINGS: Brain: No evidence of acute territorial infarction, hemorrhage,  hydrocephalus,extra-axial collection or mass lesion/mass effect. There is dilatation the ventricles and sulci consistent with age-related atrophy. Low-attenuation changes in the deep white matter consistent with small vessel ischemia. Vascular: No hyperdense vessel or unexpected calcification. Skull: The skull is intact. No fracture or focal lesion identified. Sinuses/Orbits: The visualized paranasal sinuses and mastoid air cells are clear. The orbits and globes intact. Other: None Cervical spine: Alignment: There is a grade 1 anterolisthesis C3-C4 measuring 3 mm C4-C5 measuring 4 mm which is unchanged since 2014. Skull base and vertebrae: Visualized skull base is intact. No atlanto-occipital dissociation. The vertebral body heights are well maintained. No fracture or pathologic osseous lesion seen. Soft tissues and spinal canal: The visualized paraspinal soft tissues are unremarkable. No prevertebral soft tissue swelling is seen. The spinal canal is grossly unremarkable, no large epidural collection or significant canal narrowing. Disc levels: Multilevel spine spondylosis with disc osteophyte uncovertebral osteophytes most notable C5-C6 severe neural foraminal narrowing and moderate central canal stenosis. Upper chest: Biapical scarring is seen. Thoracic inlet is within normal limits. Other: None IMPRESSION: No acute intracranial abnormality. Findings consistent with age related atrophy and chronic small vessel ischemia No acute  fracture or malalignment of the spine. Electronically Signed   By: Prudencio Pair M.D.   On: 02/06/2020 00:12   DG Hip Unilat W or Wo Pelvis 2-3 Views Left  Result Date: 02/06/2020 CLINICAL DATA:  Fall and pain EXAM: DG HIP (WITH OR WITHOUT PELVIS) 2-3V LEFT COMPARISON:  None. FINDINGS: There is a displaced transcervical femoral neck fracture of the left hip. There is superior displacement of the femoral shaft. The femoral head is rotated however still within the acetabulum. There is  diffuse osteopenia noted. Overlying soft tissue swelling. IMPRESSION: Displaced left transcervical femoral neck fracture. Electronically Signed   By: Prudencio Pair M.D.   On: 02/06/2020 00:06    Positive ROS: All other systems have been reviewed and were otherwise negative with the exception of those mentioned in the HPI and as above.  Physical Exam: General: Alert, no acute distress  MUSCULOSKELETAL: Left lower extremity: Patient skin is intact.  There is no erythema ecchymosis or significant swelling.  Thigh compartments are soft and compressible.  Patient shortening and external rotation to the left lower extremity.  She has palpable pedal pulses.  Patient with spontaneous flexion extension of her toes and dorsiflexion plantarflexion of the ankle.  Is difficult to assess sensation given the patient's dementia.  Assessment: Left displaced femoral neck hip fracture  Plan: I recommended a left hip hemiarthroplasty for treatment of this patient's fracture.  I reviewed the details of the operation as well as the postoperative course with the patient's son.  I discussed the risks and benefits of surgery. The risks include but are not limited to infection, bleeding requiring blood transfusion, nerve or blood vessel injury, joint stiffness or loss of motion, persistent pain, weakness or instability, fracture, dislocation, leg length discrepancy, change in lower extremity rotation and hardware failure and the need for further surgery. Medical risks include but are not limited to DVT and pulmonary embolism, myocardial infarction, stroke, pneumonia, respiratory failure and death. Patient understood these risks and wished to proceed.     Thornton Park, MD    02/06/2020 12:58 PM

## 2020-02-07 DIAGNOSIS — N182 Chronic kidney disease, stage 2 (mild): Secondary | ICD-10-CM

## 2020-02-07 LAB — BASIC METABOLIC PANEL
Anion gap: 6 (ref 5–15)
BUN: 18 mg/dL (ref 8–23)
CO2: 25 mmol/L (ref 22–32)
Calcium: 7.7 mg/dL — ABNORMAL LOW (ref 8.9–10.3)
Chloride: 107 mmol/L (ref 98–111)
Creatinine, Ser: 0.76 mg/dL (ref 0.44–1.00)
GFR calc Af Amer: >60 mL/min (ref >60)
GFR calc non Af Amer: >60 mL/min (ref >60)
Glucose, Bld: 119 mg/dL — ABNORMAL HIGH (ref 70–99)
Potassium: 3.7 mmol/L (ref 3.5–5.1)
Sodium: 138 mmol/L (ref 135–145)

## 2020-02-07 LAB — CBC
HCT: 29.3 % — ABNORMAL LOW (ref 36.0–46.0)
Hemoglobin: 9.6 g/dL — ABNORMAL LOW (ref 12.0–15.0)
MCH: 33.1 pg (ref 26.0–34.0)
MCHC: 32.8 g/dL (ref 30.0–36.0)
MCV: 101 fL — ABNORMAL HIGH (ref 80.0–100.0)
Platelets: 157 10*3/uL (ref 150–400)
RBC: 2.9 MIL/uL — ABNORMAL LOW (ref 3.87–5.11)
RDW: 14.2 % (ref 11.5–15.5)
WBC: 9.9 10*3/uL (ref 4.0–10.5)
nRBC: 0 % (ref 0.0–0.2)

## 2020-02-07 MED ORDER — DEXTROSE IN LACTATED RINGERS 5 % IV SOLN: INTRAVENOUS | Status: DC

## 2020-02-07 MED ORDER — SODIUM CHLORIDE 0.9 % IV BOLUS
250.0000 mL | Freq: Once | INTRAVENOUS | Status: AC
  Administered 2020-02-07: 250 mL via INTRAVENOUS

## 2020-02-07 MED ORDER — POLYETHYLENE GLYCOL 3350 17 G PO PACK
17.0000 g | PACK | Freq: Every day | ORAL | Status: DC
  Administered 2020-02-10: 17 g via ORAL
  Filled 2020-02-07 (×3): qty 1

## 2020-02-07 NOTE — Progress Notes (Signed)
Patient spit all her medications tonight. She refused to take them.

## 2020-02-07 NOTE — Progress Notes (Signed)
   02/07/20 1245  Notify: Provider  Time Provider Notified 1245  Notification Type Face-to-face  Notification Reason Change in status  Response See new orders  Date of Provider Response 02/07/20  Time of Provider Response 1250  Document  Patient Outcome Stabilized after interventions  Progress note created (see row info) Yes  new orders for bolus, and continuous fluids

## 2020-02-07 NOTE — Progress Notes (Signed)
PT Cancellation Note  Patient Details Name: Felicia Terry MRN: [id number] DOB: 03-29-1930   Cancelled Treatment:    Reason Eval/Treat Not Completed: Other (comment). Upon PT entering room, pt unable to tell therapist her name, but said yes when asked to confirm who she was. Unable to follow simple commands but gestures towards food tray. Food tray set up in front of pt and she reached forward to eat with her hands, and able to drink her milk unassisted. PT to re-attempt as able.   Lieutenant Diego PT, DPT 10:39 AM,02/07/20

## 2020-02-07 NOTE — Evaluation (Addendum)
Physical Therapy Evaluation Patient Details Name: Felicia Terry MRN: [id number] DOB: [birth year] Today's Date: 02/07/2020   History of Present Illness  Pt is an 86 yo female s/p a fall at her assisted living facility, underwent L hip hemiarthroplasty, WBAT. PMH of CAD, HTN, afib, dementia.    Clinical Impression  Patient easily woken, pleasantly confused throughout session. Unable to provide PLOF or state her name, PT contacted [facility] for baseline information. Per facility pt at memory care unit, ambulatory with [facility], did endorse she had some unsteady gait. MinA for ADLs.  The patient wasn't able to perform LE exercises (due to difficulty following commands). With tactile cueing/faciltiation, pt able to minimally participate in functional mobility. ModA to sit EOB, pt progressed to sitting with CGA/supervision over time and after repositioning. Sit <> stand performed with RW and modA, once upon standing, minA to remain. Pt with grimacing/moaning in pain upon standing, fatigued quickly. Returned to supine maxAx2 and repositioned.  Overall the patient demonstrated deficits (see "PT Problem List") that impede the patient's functional abilities, safety, and mobility and would benefit from skilled PT intervention. Recommendation is SNF due to acute decline in functional status.   BP assessed prior to sitting EOB and while sitting EOB, 95/52 initially and then 118/54.  Follow Up Recommendations SNF    Equipment Recommendations  Rolling walker with 5" wheels    Recommendations for Other Services       Precautions / Restrictions Precautions Precautions: Fall Restrictions Weight Bearing Restrictions: Yes LLE Weight Bearing: Weight bearing as tolerated      Mobility  Bed Mobility Overal bed mobility: Needs Assistance Bed Mobility: Supine to Sit;Sit to Supine     Supine to sit: Mod assist;HOB elevated Sit to supine: Max assist;+2 for safety/equipment      Transfers Overall  transfer level: Needs assistance Equipment used: Rolling walker (2 wheeled) Transfers: Sit to/from Stand Sit to Stand: Mod assist         General transfer comment: modA to stand, minA to maintain standing  Ambulation/Gait             General Gait Details: deferred due to safety concerns  Stairs            Wheelchair Mobility    Modified Rankin (Stroke Patients Only)       Balance Overall balance assessment: Needs assistance Sitting-balance support: Feet supported Sitting balance-Leahy Scale: Fair Sitting balance - Comments: progressed to sitting EOB with supervision/CGA after repositioning     Standing balance-Leahy Scale: Poor                               Pertinent Vitals/Pain Pain Assessment: Faces Faces Pain Scale: Hurts little more Pain Location: with mobility especially of L hip Pain Descriptors / Indicators: Grimacing;Guarding Pain Intervention(s): Limited activity within patient's tolerance;Monitored during session;Repositioned    Home Living Family/patient expects to be discharged to:: Assisted living     Type of Home: Apartment Home Access: Level entry     Home Layout: One level   Additional Comments: pt from ALF - [place] memory care unit    Prior Function Level of Independence: Needs assistance   Gait / Transfers Assistance Needed: normally ambulatory with [facility], "[facility]". Has been on memory care unit in [place] only very recently. Husband lives on ALF side of [place]  ADL's / Homemaking Assistance Needed: set up, supervision and minA for dressing/bathing needed  Hand Dominance        Extremity/Trunk Assessment                Communication   Communication: Other (comment) (pt with occasionally clear words during speech, mostly tangential/innappropriate speech noted)  Cognition Arousal/Alertness: Awake/alert Behavior During Therapy: WFL for tasks assessed/performed Overall Cognitive  Status: No family/caregiver present to determine baseline cognitive functioning                                 General Comments: per facility pt disoriented x3, able to state her name. Pt with some tactile cues and facilitation of initiation of task, able to assist      General Comments      Exercises     Assessment/Plan    PT Assessment Patient needs continued PT services  PT Problem List Decreased strength;Decreased mobility;Decreased safety awareness;Decreased range of motion;Decreased activity tolerance;Decreased balance;Decreased knowledge of use of DME;Pain;Decreased cognition       PT Treatment Interventions DME instruction;Therapeutic exercise;Gait training;Balance training;Stair training;Neuromuscular re-education;Functional mobility training;Therapeutic activities;Patient/family education    PT Goals (Current goals can be found in the Care Plan section)  Acute Rehab PT Goals PT Goal Formulation: Patient unable to participate in goal setting Time For Goal Achievement: 02/21/20 Potential to Achieve Goals: Good    Frequency 7X/week   Barriers to discharge Decreased caregiver support      Co-evaluation               AM-PAC PT "6 Clicks" Mobility  Outcome Measure Help needed turning from your back to your side while in a flat bed without using bedrails?: A Little Help needed moving from lying on your back to sitting on the side of a flat bed without using bedrails?: A Little Help needed moving to and from a bed to a chair (including a wheelchair)?: A Little Help needed standing up from a chair using your arms (e.g., wheelchair or bedside chair)?: A Little Help needed to walk in hospital room?: A Lot Help needed climbing 3-5 steps with a railing? : Total 6 Click Score: 15    End of Session Equipment Utilized During Treatment: Gait belt Activity Tolerance: Patient tolerated treatment well Patient left: in bed;with call bell/phone within  reach;with bed alarm set;with SCD's reapplied Nurse Communication: Mobility status PT Visit Diagnosis: Other abnormalities of gait and mobility (R26.89);Muscle weakness (generalized) (M62.81);Pain Pain - Right/Left: Left Pain - part of body: Hip    Time: 7262-0355 PT Time Calculation (min) (ACUTE ONLY): 27 min   Charges:   PT Evaluation $PT Eval Moderate Complexity: 1 Mod PT Treatments $Therapeutic Exercise: 8-22 mins        Lieutenant Diego PT, DPT 2:07 PM,02/07/20

## 2020-02-07 NOTE — Progress Notes (Signed)
Subjective:  POD #1 s/p left hip hemiarthroplasty.   Patient confused.  Patient seen with physical therapist.  Patient in no acute distress.  Patient unable to provide a history or follow commands.  Objective:   VITALS:   Vitals:   02/07/20 0030 02/07/20 0426 02/07/20 0901 02/07/20 1218  BP: (!) 115/58 (!) 94/47 109/62 (!) 82/40  Pulse: (!) 52 (!) 52 95 (!) 48  Resp: 18 16 17 14   Temp: 98.1 F (36.7 C) 97.6 F (36.4 C) 97.9 F (36.6 C) 98.4 F (36.9 C)  TempSrc: Oral Oral Oral Axillary  SpO2: 96% 90% 92% 92%  Weight:      Height:        PHYSICAL EXAM: Left lower extremity Intact pulses distally Dorsiflexion/Plantar flexion intact Incision: scant drainage No cellulitis present Compartment soft  LABS  Results for orders placed or performed during the hospital encounter of 02/05/20 (from the past 24 hour(s))  CBC     Status: Abnormal   Collection Time: 02/07/20  2:46 AM  Result Value Ref Range   WBC 9.9 4.0 - 10.5 K/uL   RBC 2.90 (L) 3.87 - 5.11 MIL/uL   Hemoglobin 9.6 (L) 12.0 - 15.0 g/dL   HCT 29.3 (L) 36 - 46 %   MCV 101.0 (H) 80.0 - 100.0 fL   MCH 33.1 26.0 - 34.0 pg   MCHC 32.8 30.0 - 36.0 g/dL   RDW 14.2 11.5 - 15.5 %   Platelets 157 150 - 400 K/uL   nRBC 0.0 0.0 - 0.2 %  Basic metabolic panel     Status: Abnormal   Collection Time: 02/07/20  2:46 AM  Result Value Ref Range   Sodium 138 135 - 145 mmol/L   Potassium 3.7 3.5 - 5.1 mmol/L   Chloride 107 98 - 111 mmol/L   CO2 25 22 - 32 mmol/L   Glucose, Bld 119 (H) 70 - 99 mg/dL   BUN 18 8 - 23 mg/dL   Creatinine, Ser 0.76 0.44 - 1.00 mg/dL   Calcium 7.7 (L) 8.9 - 10.3 mg/dL   GFR calc non Af Amer >60 >60 mL/min   GFR calc Af Amer >60 >60 mL/min   Anion gap 6 5 - 15    DG Chest 1 View  Result Date: 02/06/2020 CLINICAL DATA:  Status post fall.  Preoperative evaluation. EXAM: CHEST  1 VIEW COMPARISON:  February 09, 2018 FINDINGS: There is no evidence of acute infiltrate, pleural effusion or  pneumothorax. The heart size and mediastinal contours are within normal limits. There is marked severity calcification of the aortic arch. Degenerative changes seen involving the bilateral shoulders and thoracic spine. IMPRESSION: No active disease. Electronically Signed   By: Virgina Norfolk M.D.   On: 02/06/2020 00:03   CT Head Wo Contrast  Result Date: 02/06/2020 CLINICAL DATA:  Fall EXAM: CT HEAD WITHOUT CONTRAST TECHNIQUE: Contiguous axial images were obtained from the base of the skull through the vertex without intravenous contrast. COMPARISON:  None. FINDINGS: Brain: No evidence of acute territorial infarction, hemorrhage, hydrocephalus,extra-axial collection or mass lesion/mass effect. There is dilatation the ventricles and sulci consistent with age-related atrophy. Low-attenuation changes in the deep white matter consistent with small vessel ischemia. Vascular: No hyperdense vessel or unexpected calcification. Skull: The skull is intact. No fracture or focal lesion identified. Sinuses/Orbits: The visualized paranasal sinuses and mastoid air cells are clear. The orbits and globes intact. Other: None Cervical spine: Alignment: There is a grade 1 anterolisthesis C3-C4 measuring 3 mm  C4-C5 measuring 4 mm which is unchanged since 2014. Skull base and vertebrae: Visualized skull base is intact. No atlanto-occipital dissociation. The vertebral body heights are well maintained. No fracture or pathologic osseous lesion seen. Soft tissues and spinal canal: The visualized paraspinal soft tissues are unremarkable. No prevertebral soft tissue swelling is seen. The spinal canal is grossly unremarkable, no large epidural collection or significant canal narrowing. Disc levels: Multilevel spine spondylosis with disc osteophyte uncovertebral osteophytes most notable C5-C6 severe neural foraminal narrowing and moderate central canal stenosis. Upper chest: Biapical scarring is seen. Thoracic inlet is within normal  limits. Other: None IMPRESSION: No acute intracranial abnormality. Findings consistent with age related atrophy and chronic small vessel ischemia No acute fracture or malalignment of the spine. Electronically Signed   By: Prudencio Pair M.D.   On: 02/06/2020 00:12   CT Cervical Spine Wo Contrast  Result Date: 02/06/2020 CLINICAL DATA:  Fall EXAM: CT HEAD WITHOUT CONTRAST TECHNIQUE: Contiguous axial images were obtained from the base of the skull through the vertex without intravenous contrast. COMPARISON:  None. FINDINGS: Brain: No evidence of acute territorial infarction, hemorrhage, hydrocephalus,extra-axial collection or mass lesion/mass effect. There is dilatation the ventricles and sulci consistent with age-related atrophy. Low-attenuation changes in the deep white matter consistent with small vessel ischemia. Vascular: No hyperdense vessel or unexpected calcification. Skull: The skull is intact. No fracture or focal lesion identified. Sinuses/Orbits: The visualized paranasal sinuses and mastoid air cells are clear. The orbits and globes intact. Other: None Cervical spine: Alignment: There is a grade 1 anterolisthesis C3-C4 measuring 3 mm C4-C5 measuring 4 mm which is unchanged since 2014. Skull base and vertebrae: Visualized skull base is intact. No atlanto-occipital dissociation. The vertebral body heights are well maintained. No fracture or pathologic osseous lesion seen. Soft tissues and spinal canal: The visualized paraspinal soft tissues are unremarkable. No prevertebral soft tissue swelling is seen. The spinal canal is grossly unremarkable, no large epidural collection or significant canal narrowing. Disc levels: Multilevel spine spondylosis with disc osteophyte uncovertebral osteophytes most notable C5-C6 severe neural foraminal narrowing and moderate central canal stenosis. Upper chest: Biapical scarring is seen. Thoracic inlet is within normal limits. Other: None IMPRESSION: No acute intracranial  abnormality. Findings consistent with age related atrophy and chronic small vessel ischemia No acute fracture or malalignment of the spine. Electronically Signed   By: Prudencio Pair M.D.   On: 02/06/2020 00:12   DG Hip Port Unilat With Pelvis 1V Left  Result Date: 02/06/2020 CLINICAL DATA:  LEFT hip hemiarthroplasty EXAM: DG HIP (WITH OR WITHOUT PELVIS) 1V PORT LEFT COMPARISON:  02/05/2020 FINDINGS: LEFT hip hemiarthroplasty changes noted without complicating features. No acute fracture or dislocation. IMPRESSION: LEFT hip hemiarthroplasty without complicating features. Electronically Signed   By: Margarette Canada M.D.   On: 02/06/2020 16:55   DG Hip Unilat W or Wo Pelvis 2-3 Views Left  Result Date: 02/06/2020 CLINICAL DATA:  Fall and pain EXAM: DG HIP (WITH OR WITHOUT PELVIS) 2-3V LEFT COMPARISON:  None. FINDINGS: There is a displaced transcervical femoral neck fracture of the left hip. There is superior displacement of the femoral shaft. The femoral head is rotated however still within the acetabulum. There is diffuse osteopenia noted. Overlying soft tissue swelling. IMPRESSION: Displaced left transcervical femoral neck fracture. Electronically Signed   By: Prudencio Pair M.D.   On: 02/06/2020 00:06    Assessment/Plan: 1 Day Post-Op   Principal Problem:   Closed left hip fracture (HCC) Active Problems:   Dementia (  [place])   Dyslipidemia   Paroxysmal atrial fibrillation (HCC)   Essential hypertension   CKD stage G2/A2, GFR 60-89 and albumin creatinine ratio 30-299 mg/g  Patient stable postop.  Hemoglobin and hematocrit are within an acceptable range.  Continue PT as patient can participate.  Continue current pain management.  Patient will need skilled nursing facility upon discharge.  Patient should continue to observe posterior hip precautions and wear an abduction brace while lying in bed.    Thornton Park , MD 02/07/2020, 1:44 PM

## 2020-02-07 NOTE — Progress Notes (Signed)
   02/07/20 1218  Assess: MEWS Score  Temp 98.4 F (36.9 C)  BP (!) 82/40  Pulse Rate (!) 48  Resp 14  SpO2 92 %  O2 Device Room Air  Assess: MEWS Score  MEWS Temp 0  MEWS Systolic 1  MEWS Pulse 1  MEWS RR 0  MEWS LOC 0  MEWS Score 2  MEWS Score Color Yellow  Assess: if the MEWS score is Yellow or Red  Were vital signs taken at a resting state? Yes  Focused Assessment Change from prior assessment (see assessment flowsheet)  Early Detection of Sepsis Score *See Row Information* Low  MEWS guidelines implemented *See Row Information* Yes  Treat  MEWS Interventions Escalated (See documentation below)  Take Vital Signs  Increase Vital Sign Frequency  Yellow: Q 2hr X 2 then Q 4hr X 2, if remains yellow, continue Q 4hrs  Escalate  MEWS: Escalate Yellow: discuss with charge nurse/RN and consider discussing with provider and RRT  Notify: Provider  Provider Name/Title DR. Arrien  Date Provider Notified 02/07/20  spoke with MD, ordered Bolus

## 2020-02-07 NOTE — Progress Notes (Signed)
PROGRESS NOTE    Felicia Terry  OMB:[id number] DOB: 1930-05-02 DOA: 02/05/2020 PCP: [facility]    Brief Narrative:  Patient admitted to the hospital with the working diagnosis of acute left hip fracture.  86 year old female with past medical history of coronary artery disease, hypertension, paroxysmal atrial fibrillation and dementia who presented after mechanical fall.  Apparently she fell while trying to get into the bathroom, when EMS arrived she was found on the floor, with evident pain, unable to stand back on her feet.  On her initial physical examination blood pressure 156/77, heart rate 61, temperature 98.8, respiratory 20, oxygen saturation 96%. Her lungs are clear to auscultation bilaterally, heart S1-S2, present rhythmic, abdomen soft, no lower extremity edema, positive left hip tenderness. Sodium 137, potassium 3.6, chloride 102, bicarb 24, glucose 106, BUN 21, creatinine 0.79, white count 9.0, hemoglobin 9.7, hematocrit 35.3, platelets 189.  SARS COVID-19 was negative.  Urinalysis specific gravity 1.012, 6-10 red cells, 0-5 white cells.  Head and cervical spine no acute changes.  Chest radiograph no infiltrates.  EKG 70 bpm, normal axis, normal intervals, sinus rhythm, poor R wave progression, no ST segment or T wave changes  Left hip films with displaced left transcervical femoral neck     Assessment & Plan:   Principal Problem:   Closed left hip fracture (HCC) Active Problems:   Dementia (HCC)   Dyslipidemia   Paroxysmal atrial fibrillation (HCC)   Essential hypertension   CKD stage G2/A2, GFR 60-89 and albumin creatinine ratio 30-299 mg/g   1. Acute left hip fracture. Sp left hip arthroplasty on 08/30. Continue pain control with acetaminophen, hydrocodone, ketorolac, methocarbamol, tramadol and morphine.  Patient unable to do physical therapy today due to poor cooperation and confusion. Follow with orthopedics surgery recommendations.   2.  Paroxysmal atrial fibrillation. Continue with diltiazem for rate control. Not on antithrombotic therapy as outpatient, likely due to fall risk and dementia.  Continue telemetry monitoring.   3. HTN/ NEW hypotension. Blood pressure down to 82/40 mmHg, HR 48, will add NS bolus 250 ml x1 and continue close monitoring. Patient is very weak and deconditioned.   Will dc lisinopril and propranolol for now, continue on diltiazem to prevent uncontrolled atrial fibrillation.    4.  Dementia. Patient has been confused and episodic agitation, will continue with Aricept and mirtazapine. Continue neuro checks per unit protocol, will add dextrose to IV fluids and will consult nutrition for recommendations.  Patient at elevated risk for delirium. Continue trazodone at night for sleep.   5. GERD. Continue with pantoprazole.   6. CKD stage 2. .Renal function with serum cr at 0,76 with K at 3,7 and serum bicarbonate 25.   Poor oral intake, will add dextrose to IV fluids and follow renal panel in am, avoid hypotension and nephrotoxic medications.   7. Hypothyroid. Continue with levothyroxine.   8. Dyslipidemia. Continue with pravastatin.   Patient continue to be at high risk for worsening hypotension   Status is: Inpatient  Remains inpatient appropriate because:IV treatments appropriate due to intensity of illness or inability to take PO   Dispo: The patient is from: Home              Anticipated d/c is to: SNF              Anticipated d/c date is: 3 days              Patient currently is not medically stable to d/c.  DVT prophylaxis: Enoxaparin   Code Status:   dnr   Family Communication:  No family at the bedside     Consultants:   Orthopedics   Procedures:   Left hip arthroplasty      Subjective: Patient is confused and disorientated, does not look in pain, per nursing patient has been agitated at times.   Objective: Vitals:   02/06/20 2129 02/07/20 0030 02/07/20 0426  02/07/20 0901  BP: (!) 131/59 (!) 115/58 (!) 94/47 109/62  Pulse: 75 (!) 52 (!) 52 95  Resp: 17 18 16 17   Temp: 97.7 F (36.5 C) 98.1 F (36.7 C) 97.6 F (36.4 C) 97.9 F (36.6 C)  TempSrc: Oral Oral Oral Oral  SpO2: 95% 96% 90% 92%  Weight:      Height:        Intake/Output Summary (Last 24 hours) at 02/07/2020 1049 Last data filed at 02/07/2020 0600 Gross per 24 hour  Intake 1933.16 ml  Output 1300 ml  Net 633.16 ml   Filed Weights   02/05/20 2300 02/06/20 1306  Weight: 54.4 kg 54.4 kg    Examination:   General: Not in pain or dyspnea, deconditioned and ill looking appearing  Neurology: Awake and alert, non focal, patient confused and disorientated, not agitated at the time of my examination.  E ENT: mild  pallor, no icterus, oral mucosa moist Cardiovascular: No JVD. S1-S2 present, rhythmic, no gallops, rubs, or murmurs. No lower extremity edema. Pulmonary: positive breath sounds bilaterally, decreased air movement due to poor inspiratory effort, no wheezing, rhonchi or rales. Gastrointestinal. Abdomen soft and non tender,  Skin. No rashes Musculoskeletal: no joint deformities     Data Reviewed: I have personally reviewed following labs and imaging studies  CBC: Recent Labs  Lab 02/06/20 0046 02/06/20 0447 02/07/20 0246  WBC 9.0 11.4* 9.9  HGB 11.7* 11.4* 9.6*  HCT 35.3* 34.5* 29.3*  MCV 100.0 99.4 101.0*  PLT 189 173 353   Basic Metabolic Panel: Recent Labs  Lab 02/06/20 0046 02/06/20 0447 02/07/20 0246  NA 137 139 138  K 3.6 3.8 3.7  CL 102 105 107  CO2 24 25 25   GLUCOSE 106* 117* 119*  BUN 21 21 18   CREATININE 0.79 0.76 0.76  CALCIUM 8.7* 8.3* 7.7*   GFR: Estimated Creatinine Clearance: 38.4 mL/min (by C-G formula based on SCr of 0.76 mg/dL). Liver Function Tests: Recent Labs  Lab 02/06/20 0046  AST 27  ALT 17  ALKPHOS 71  BILITOT 0.9  PROT 6.8  ALBUMIN 3.7   No results for input(s): LIPASE, AMYLASE in the last 168 hours. No  results for input(s): AMMONIA in the last 168 hours. Coagulation Profile: Recent Labs  Lab 02/06/20 0046  INR 1.0   Cardiac Enzymes: No results for input(s): CKTOTAL, CKMB, CKMBINDEX, TROPONINI in the last 168 hours. BNP (last 3 results) No results for input(s): PROBNP in the last 8760 hours. HbA1C: No results for input(s): HGBA1C in the last 72 hours. CBG: No results for input(s): GLUCAP in the last 168 hours. Lipid Profile: No results for input(s): CHOL, HDL, LDLCALC, TRIG, CHOLHDL, LDLDIRECT in the last 72 hours. Thyroid Function Tests: No results for input(s): TSH, T4TOTAL, FREET4, T3FREE, THYROIDAB in the last 72 hours. Anemia Panel: No results for input(s): VITAMINB12, FOLATE, FERRITIN, TIBC, IRON, RETICCTPCT in the last 72 hours.    Radiology Studies: I have reviewed all of the imaging during this hospital visit personally     Scheduled Meds: . acetaminophen  500  mg Oral Q6H  . Chlorhexidine Gluconate Cloth  6 each Topical Daily  . diltiazem  30 mg Oral BID  . docusate sodium  100 mg Oral BID  . donepezil  5 mg Oral QHS  . enoxaparin (LOVENOX) injection  40 mg Subcutaneous Q24H  . ketorolac  7.5 mg Intravenous Q6H  . levothyroxine  25 mcg Oral Daily  . lisinopril  2.5 mg Oral Daily  . mirtazapine  7.5 mg Oral Daily  . multivitamin with minerals   Oral Daily  . pantoprazole  40 mg Oral Daily  . polyethylene glycol  17 g Oral QODAY  . pravastatin  40 mg Oral QHS  . propranolol ER  240 mg Oral BID  . senna  1 tablet Oral BID  . traMADol  50 mg Oral Q6H   Continuous Infusions: . sodium chloride 100 mL/hr at 02/07/20 0901  . methocarbamol (ROBAXIN) IV       LOS: 1 day        Brettany Sydney Gerome Apley, MD

## 2020-02-08 DIAGNOSIS — S72002P Fracture of unspecified part of neck of left femur, subsequent encounter for closed fracture with malunion: Secondary | ICD-10-CM

## 2020-02-08 LAB — URINALYSIS, COMPLETE (UACMP) WITH MICROSCOPIC
Bacteria, UA: NONE SEEN
Bilirubin Urine: NEGATIVE
Glucose, UA: NEGATIVE mg/dL
Hgb urine dipstick: NEGATIVE
Ketones, ur: NEGATIVE mg/dL
Leukocytes,Ua: NEGATIVE
Nitrite: NEGATIVE
Protein, ur: NEGATIVE mg/dL
Specific Gravity, Urine: 1.025 (ref 1.005–1.030)
pH: 5 (ref 5.0–8.0)

## 2020-02-08 LAB — CBC
HCT: 27.6 % — ABNORMAL LOW (ref 36.0–46.0)
Hemoglobin: 9.2 g/dL — ABNORMAL LOW (ref 12.0–15.0)
MCH: 33.8 pg (ref 26.0–34.0)
MCHC: 33.3 g/dL (ref 30.0–36.0)
MCV: 101.5 fL — ABNORMAL HIGH (ref 80.0–100.0)
Platelets: 143 10*3/uL — ABNORMAL LOW (ref 150–400)
RBC: 2.72 MIL/uL — ABNORMAL LOW (ref 3.87–5.11)
RDW: 14.5 % (ref 11.5–15.5)
WBC: 10 10*3/uL (ref 4.0–10.5)
nRBC: 0 % (ref 0.0–0.2)

## 2020-02-08 LAB — BASIC METABOLIC PANEL
Anion gap: 6 (ref 5–15)
BUN: 24 mg/dL — ABNORMAL HIGH (ref 8–23)
CO2: 23 mmol/L (ref 22–32)
Calcium: 7.9 mg/dL — ABNORMAL LOW (ref 8.9–10.3)
Chloride: 107 mmol/L (ref 98–111)
Creatinine, Ser: 0.73 mg/dL (ref 0.44–1.00)
GFR calc Af Amer: >60 mL/min (ref >60)
GFR calc non Af Amer: >60 mL/min (ref >60)
Glucose, Bld: 109 mg/dL — ABNORMAL HIGH (ref 70–99)
Potassium: 3.8 mmol/L (ref 3.5–5.1)
Sodium: 136 mmol/L (ref 135–145)

## 2020-02-08 MED ORDER — CALCIUM CARBONATE-VITAMIN D 500-200 MG-UNIT PO TABS
1.0000 | ORAL_TABLET | Freq: Two times a day (BID) | ORAL | Status: DC
  Administered 2020-02-09 – 2020-02-10 (×2): 1 via ORAL
  Filled 2020-02-08 (×3): qty 1

## 2020-02-08 MED ORDER — OLANZAPINE 5 MG PO TBDP
2.5000 mg | ORAL_TABLET | Freq: Every day | ORAL | Status: DC
  Administered 2020-02-08 – 2020-02-10 (×2): 2.5 mg via ORAL
  Filled 2020-02-08 (×3): qty 0.5

## 2020-02-08 MED ORDER — LORAZEPAM 2 MG/ML IJ SOLN
0.2500 mg | Freq: Three times a day (TID) | INTRAMUSCULAR | Status: DC | PRN
  Administered 2020-02-08: 0.25 mg via INTRAVENOUS
  Filled 2020-02-08: qty 1

## 2020-02-08 MED ORDER — HYDROXYZINE HCL 10 MG PO TABS
10.0000 mg | ORAL_TABLET | Freq: Three times a day (TID) | ORAL | Status: DC | PRN
  Filled 2020-02-08: qty 1

## 2020-02-08 NOTE — TOC Progression Note (Signed)
Transition of Care [facility]) - Progression Note    Patient Details  Name: Felicia Terry MRN: [id number] Date of Birth: February 25, 1930  Transition of Care [facility]) CM/SW [place], RN Phone Number: 02/08/2020, 8:53 AM  Clinical Narrative:   Spoke with the patient's son Felicia Terry, Reviewed the bed offers and star rating, he chose [facility], I called Tonya and let her know about the acceptance , The patient has had the vaccines and her son can provide the vaccine cards to [facility], I will contact the son Felicia Terry once DC is in place         Expected Discharge Plan and Services                                                 Social Determinants of Health (SDOH) Interventions    Readmission Risk Interventions No flowsheet data found.

## 2020-02-08 NOTE — Progress Notes (Signed)
Patient had a urine incontinence episode.

## 2020-02-08 NOTE — Progress Notes (Signed)
PROGRESS NOTE    Felicia Terry  KZL:[id number] DOB: 29-Oct-1929 DOA: 02/05/2020 PCP: [facility]    Chief Complaint  Patient presents with  . Fall    Brief Narrative:  Patient admitted to the hospitalwith theworking diagnosis of acute left hip fracture.  86 year old female with past medical history of coronary artery disease, hypertension, paroxysmal atrial fibrillation and dementia who presented after mechanical fall. Apparently she fell while trying to get into the bathroom, when EMS arrived she was found on the floor, with evident pain, unable to stand back on her feet. On her initial physical examination blood pressure 156/77, heart rate 61, temperature 98.8, respiratory 20, oxygen saturation 96%. Her lungs are clear to auscultation bilaterally, heart S1-S2, present rhythmic, abdomen soft, no lower extremity edema, positive left hip tenderness. Sodium 137, potassium 3.6, chloride 102, bicarb 24, glucose 106, BUN 21, creatinine 0.79, white count 9.0, hemoglobin 9.7, hematocrit 35.3, platelets 189. SARS COVID-19 was negative.Urinalysis specific gravity 1.012, 6-10 red cells, 0-5 white cells. Head and cervical spine no acute changes. Chest radiograph no infiltrates. EKG 70 bpm, normal axis, normal intervals, sinus rhythm, poor R wave progression, no ST segment or T wave changes  Left hip films with displaced left transcervical femoral neck    Assessment & Plan:   Principal Problem:   Closed left hip fracture (HCC) Active Problems:   Dementia (HCC)   Dyslipidemia   Paroxysmal atrial fibrillation (HCC)   Essential hypertension   CKD stage G2/A2, GFR 60-89 and albumin creatinine ratio 30-299 mg/g  1 acute left hip fracture Likely secondary to mechanical fall.  Patient seen by orthopedics.  Status post left hip hemiarthroplasty 02/06/2020.  Continue current pain regimen of Ultram, Norco, Robaxin, morphine.  PT/OT.  Per orthopedics.  2.  Paroxysmal atrial  fibrillation Continue Cardizem for rate control.  Patient not on anticoagulation prior to admission likely secondary to history of dementia and increased fall risk.  3.  Dementia with behavioral disturbance Patient with dementia, noted to have confusion and agitation which has continued this morning.  Patient noted to be pulling telemetry monitors off, agitated, pulling out lines.  Continue Aricept and mirtazapine.  Check a bladder scan to rule out urinary retention.  Check a UA with cultures and sensitivities to rule out a underlying UTI.  Place on Zyprexa 2.5 mg p.o. daily.  IV Ativan as needed.  Continue trazodone at bedtime for sleep.  Discontinue telemetry.  Supportive care.  4.  Gastroesophageal reflux disease PPI.  5.  Chronic kidney disease stage II Renal function stable.  Continue gentle hydration.  Follow.  6.  Hypothyroidism Continue home regimen Synthroid.  7.  Hypotension Patient noted yesterday to have low blood pressure.  Patient placed on IV fluids.  Patient noted to be weak and deconditioned.  Check UA to rule out UTI.  Continue IV fluids.  Supportive care.  8.  Hyperlipidemia Continue statin.   DVT prophylaxis: Lovenox Code Status: DNR Family Communication: No family at bedside. Disposition:   Status is: Inpatient    Dispo: The patient is from: SNF              Anticipated d/c is to: Likely SNF              Anticipated d/c date is: 1 to 2 days              Patient currently with some bouts of agitation, not stable for discharge.       Consultants:  Orthopedics: Dr. Mack Guise 02/06/2020  Procedures:   CT head CT C-spine 02/06/2020  Chest x-ray 02/05/2020  Plain films of the left hip 02/05/2020  Left hip hemiarthroplasty per Dr. Mack Guise 02/06/2020  Antimicrobials:  None   Subjective: Patient noted to be pulling off leads, agitated, pulling of lines.  RN and nurse tech at bedside.  Patient noted to be spitting out medications per  RN.  Objective: Vitals:   02/07/20 1615 02/07/20 1944 02/07/20 2351 02/08/20 0838  BP: 98/67 105/69 128/62 (!) 155/57  Pulse: (!) 49 (!) 52 (!) 57 (!) 48  Resp: 17 16 18 18   Temp: 98.2 F (36.8 C) 98 F (36.7 C) 98.6 F (37 C) (!) 97.5 F (36.4 C)  TempSrc: Oral  Oral Oral  SpO2: 92% 92% 95% 90%  Weight:      Height:        Intake/Output Summary (Last 24 hours) at 02/08/2020 1222 Last data filed at 02/08/2020 1010 Gross per 24 hour  Intake 2041.42 ml  Output --  Net 2041.42 ml   Filed Weights   02/05/20 2300 02/06/20 1306  Weight: 54.4 kg 54.4 kg    Examination:  General exam: Somewhat agitated. Respiratory system: Clear to auscultation anterior lung fields. Respiratory effort normal. Cardiovascular system: Regular rate rhythm no murmurs rubs or gallops.  No JVD.  No lower extremity edema.  Gastrointestinal system: Abdomen is nondistended, soft and nontender. No organomegaly or masses felt. Normal bowel sounds heard. Central nervous system: Alert. No focal neurological deficits. Extremities: Symmetric 5 x 5 power. Skin: No rashes, lesions or ulcers Psychiatry: Judgement and insight appear poor. Mood & affect appropriate.     Data Reviewed: I have personally reviewed following labs and imaging studies  CBC: Recent Labs  Lab 02/06/20 0046 02/06/20 0447 02/07/20 0246 02/08/20 0452  WBC 9.0 11.4* 9.9 10.0  HGB 11.7* 11.4* 9.6* 9.2*  HCT 35.3* 34.5* 29.3* 27.6*  MCV 100.0 99.4 101.0* 101.5*  PLT 189 173 157 143*    Basic Metabolic Panel: Recent Labs  Lab 02/06/20 0046 02/06/20 0447 02/07/20 0246 02/08/20 0452  NA 137 139 138 136  K 3.6 3.8 3.7 3.8  CL 102 105 107 107  CO2 24 25 25 23   GLUCOSE 106* 117* 119* 109*  BUN 21 21 18  24*  CREATININE 0.79 0.76 0.76 0.73  CALCIUM 8.7* 8.3* 7.7* 7.9*    GFR: Estimated Creatinine Clearance: 38.4 mL/min (by C-G formula based on SCr of 0.73 mg/dL).  Liver Function Tests: Recent Labs  Lab 02/06/20 0046  AST  27  ALT 17  ALKPHOS 71  BILITOT 0.9  PROT 6.8  ALBUMIN 3.7    CBG: No results for input(s): GLUCAP in the last 168 hours.   Recent Results (from the past 240 hour(s))  SARS Coronavirus 2 by RT PCR (hospital order, performed in [facility] hospital lab) Nasopharyngeal Nasopharyngeal Swab     Status: None   Collection Time: 02/06/20 12:46 AM   Specimen: Nasopharyngeal Swab  Result Value Ref Range Status   SARS Coronavirus 2 NEGATIVE NEGATIVE Final    Comment: (NOTE) SARS-CoV-2 target nucleic acids are NOT DETECTED.  The SARS-CoV-2 RNA is generally detectable in upper and lower respiratory specimens during the acute phase of infection. The lowest concentration of SARS-CoV-2 viral copies this assay can detect is 250 copies / mL. A negative result does not preclude SARS-CoV-2 infection and should not be used as the sole basis for treatment or other patient management decisions.  A  negative result may occur with improper specimen collection / handling, submission of specimen other than nasopharyngeal swab, presence of viral mutation(s) within the areas targeted by this assay, and inadequate number of viral copies (<250 copies / mL). A negative result must be combined with clinical observations, patient history, and epidemiological information.  Fact Sheet for Patients:   StrictlyIdeas.no  Fact Sheet for Healthcare Providers: BankingDealers.co.za  This test is not yet approved or  cleared by the [place] FDA and has been authorized for detection and/or diagnosis of SARS-CoV-2 by FDA under an Emergency Use Authorization (EUA).  This EUA will remain in effect (meaning this test can be used) for the duration of the COVID-19 declaration under Section 564(b)(1) of the Act, 21 U.S.C. section 360bbb-3(b)(1), unless the authorization is terminated or revoked sooner.  Performed at [facility], [address],  [place], [place] [address]   Surgical pcr screen     Status: None   Collection Time: 02/06/20  2:45 AM   Specimen: Nasal Mucosa; Nasal Swab  Result Value Ref Range Status   MRSA, PCR NEGATIVE NEGATIVE Final   Staphylococcus aureus NEGATIVE NEGATIVE Final    Comment: (NOTE) The Xpert SA Assay (FDA approved for NASAL specimens in patients 88 years of age and older), is one component of a comprehensive surveillance program. It is not intended to diagnose infection nor to guide or monitor treatment. Performed at [facility], [address], [place], [place] [address]          Radiology Studies: DG Hip Port Unilat With Pelvis 1V Left  Result Date: 02/06/2020 CLINICAL DATA:  LEFT hip hemiarthroplasty EXAM: DG HIP (WITH OR WITHOUT PELVIS) 1V PORT LEFT COMPARISON:  02/05/2020 FINDINGS: LEFT hip hemiarthroplasty changes noted without complicating features. No acute fracture or dislocation. IMPRESSION: LEFT hip hemiarthroplasty without complicating features. Electronically Signed   By: Margarette Canada M.D.   On: 02/06/2020 16:55        Scheduled Meds: . Chlorhexidine Gluconate Cloth  6 each Topical Daily  . diltiazem  30 mg Oral BID  . docusate sodium  100 mg Oral BID  . donepezil  5 mg Oral QHS  . enoxaparin (LOVENOX) injection  40 mg Subcutaneous Q24H  . levothyroxine  25 mcg Oral Daily  . mirtazapine  7.5 mg Oral Daily  . multivitamin with minerals   Oral Daily  . OLANZapine zydis  2.5 mg Oral Daily  . pantoprazole  40 mg Oral Daily  . polyethylene glycol  17 g Oral Daily  . pravastatin  40 mg Oral QHS  . senna  1 tablet Oral BID  . traMADol  50 mg Oral Q6H   Continuous Infusions: . dextrose 5% lactated ringers 75 mL/hr at 02/08/20 0600  . methocarbamol (ROBAXIN) IV       LOS: 2 days    Time spent: 35 minutes    Irine Seal, MD [facility]   To contact the attending provider between 7A-7P or the covering provider during after hours 7P-7A, please  log into the web site [email or web address] and access using universal [place] password for that web site. If you do not have the password, please call the hospital operator.  02/08/2020, 12:22 PM

## 2020-02-08 NOTE — Progress Notes (Signed)
Patient is noncomplaint and pulling off her clothes and will not keep her tele leads off and replaced multiple attempts. MD notified of patient's behavior and ordered prn medication for anxiety however patient spitting out all her medications.

## 2020-02-08 NOTE — Progress Notes (Signed)
Dr. Marry Guan paged for bladder scan of 250ML

## 2020-02-08 NOTE — Progress Notes (Signed)
Physical Therapy Treatment Patient Details Name: Felicia Terry MRN: [id number] DOB: 12/08/1929 Today's Date: 02/08/2020    History of Present Illness Pt is an 86 yo female s/p a fall at her assisted living facility, underwent L hip hemiarthroplasty, WBAT. PMH of CAD, HTN, afib, dementia.    PT Comments    Pt easily woken, unable to state her name today but occasional speech today more appropriate than last session. Pt was maxA for supine<>sit, and was able to sit EOB with CGA/supervision after repositioning. Pt endorsed more pain with mobility, evidenced by gesturing and facial expressions. Pt sat EOB for at least 10 minutes, progressed to CGA/supervision. Able to wipe her face with paper towel when cued, and perform a few LAQ with PT visual demonstration. Sit <> stand performed twice, modA to stand but upon standing second attempt able to maintain standing with CGA. Repositioned in supine with maxAx2 for safety, and attempted to set up for breakfast. Pt did not reach for food or indicate interest at the moment, soft mittens redonned and CNA/RN updated of patient status. The patient would benefit from further skilled PT intervention to continue to progress towards goals. Recommendation remains appropriate.       Follow Up Recommendations  SNF     Equipment Recommendations  Rolling walker with 5" wheels    Recommendations for Other Services       Precautions / Restrictions Precautions Precautions: Fall;Posterior Hip Precaution Booklet Issued: No Restrictions Weight Bearing Restrictions: Yes LLE Weight Bearing: Weight bearing as tolerated    Mobility  Bed Mobility Overal bed mobility: Needs Assistance Bed Mobility: Supine to Sit     Supine to sit: Max assist;HOB elevated Sit to supine: Max assist;+2 for safety/equipment      Transfers Overall transfer level: Needs assistance Equipment used: Rolling walker (2 wheeled) Transfers: Sit to/from Stand Sit to Stand: Mod  assist         General transfer comment: modA to stand, minA to maintain standing, second attempt able to maintain standing CGA  Ambulation/Gait             General Gait Details: deferred due to safety concerns   Stairs             Wheelchair Mobility    Modified Rankin (Stroke Patients Only)       Balance Overall balance assessment: Needs assistance Sitting-balance support: Feet supported Sitting balance-Leahy Scale: Fair Sitting balance - Comments: progressed to sitting EOB with supervision/CGA after repositioning     Standing balance-Leahy Scale: Poor Standing balance comment: reliant on UE support to stand                            Cognition Arousal/Alertness: Awake/alert Behavior During Therapy: WFL for tasks assessed/performed Overall Cognitive Status: No family/caregiver present to determine baseline cognitive functioning                                 General Comments: per facility pt disoriented x3, able unable to state her name. Pt with some tactile cues and facilitation of initiation of task, able to assist      Exercises Other Exercises Other Exercises: Pt sat EOB for at least 10 minutes, progressed to CGA/supervision. Able t wipe her face with paper towel when cued, and perform a few LAQ with PT visual demonstration. exhibited more pain this session than last time.  General Comments        Pertinent Vitals/Pain Pain Assessment: Faces Faces Pain Scale: Hurts even more Pain Location: with mobility especially of L hip Pain Descriptors / Indicators: Grimacing;Guarding;Moaning Pain Intervention(s): Limited activity within patient's tolerance;Monitored during session;Repositioned;Premedicated before session    Home Living                      Prior Function            PT Goals (current goals can now be found in the care plan section) Progress towards PT goals: Progressing toward goals     Frequency    7X/week      PT Plan Current plan remains appropriate    Co-evaluation              AM-PAC PT "6 Clicks" Mobility   Outcome Measure  Help needed turning from your back to your side while in a flat bed without using bedrails?: A Lot Help needed moving from lying on your back to sitting on the side of a flat bed without using bedrails?: A Lot Help needed moving to and from a bed to a chair (including a wheelchair)?: A Little Help needed standing up from a chair using your arms (e.g., wheelchair or bedside chair)?: A Little Help needed to walk in hospital room?: A Lot Help needed climbing 3-5 steps with a railing? : Total 6 Click Score: 13    End of Session Equipment Utilized During Treatment: Gait belt Activity Tolerance: Patient tolerated treatment well Patient left: in bed;with call bell/phone within reach;with bed alarm set;with SCD's reapplied Nurse Communication: Mobility status PT Visit Diagnosis: Other abnormalities of gait and mobility (R26.89);Muscle weakness (generalized) (M62.81);Pain Pain - Right/Left: Left Pain - part of body: Hip     Time: 1735-6701 PT Time Calculation (min) (ACUTE ONLY): 29 min  Charges:  $Therapeutic Exercise: 8-22 mins $Therapeutic Activity: 8-22 mins                    Lieutenant Diego PT, DPT 10:06 AM,02/08/20

## 2020-02-09 LAB — BASIC METABOLIC PANEL
Anion gap: 5 (ref 5–15)
BUN: 17 mg/dL (ref 8–23)
CO2: 26 mmol/L (ref 22–32)
Calcium: 7.7 mg/dL — ABNORMAL LOW (ref 8.9–10.3)
Chloride: 105 mmol/L (ref 98–111)
Creatinine, Ser: 0.68 mg/dL (ref 0.44–1.00)
GFR calc Af Amer: >60 mL/min (ref >60)
GFR calc non Af Amer: >60 mL/min (ref >60)
Glucose, Bld: 127 mg/dL — ABNORMAL HIGH (ref 70–99)
Potassium: 3.4 mmol/L — ABNORMAL LOW (ref 3.5–5.1)
Sodium: 136 mmol/L (ref 135–145)

## 2020-02-09 LAB — CBC
HCT: 26 % — ABNORMAL LOW (ref 36.0–46.0)
Hemoglobin: 8.9 g/dL — ABNORMAL LOW (ref 12.0–15.0)
MCH: 33.5 pg (ref 26.0–34.0)
MCHC: 34.2 g/dL (ref 30.0–36.0)
MCV: 97.7 fL (ref 80.0–100.0)
Platelets: 146 10*3/uL — ABNORMAL LOW (ref 150–400)
RBC: 2.66 MIL/uL — ABNORMAL LOW (ref 3.87–5.11)
RDW: 14.5 % (ref 11.5–15.5)
WBC: 10.5 10*3/uL (ref 4.0–10.5)
nRBC: 0 % (ref 0.0–0.2)

## 2020-02-09 LAB — URINE CULTURE: Culture: NO GROWTH

## 2020-02-09 MED ORDER — MORPHINE SULFATE (PF) 2 MG/ML IV SOLN: 0.5000 mg | INTRAVENOUS | Status: DC | PRN

## 2020-02-09 MED ORDER — HYDROCODONE-ACETAMINOPHEN 7.5-325 MG PO TABS: 1.0000 | ORAL_TABLET | ORAL | Status: DC | PRN

## 2020-02-09 MED ORDER — POTASSIUM CHLORIDE 20 MEQ PO PACK
40.0000 meq | PACK | Freq: Once | ORAL | Status: AC
  Administered 2020-02-09: 40 meq via ORAL
  Filled 2020-02-09: qty 2

## 2020-02-09 MED ORDER — HYDROCODONE-ACETAMINOPHEN 5-325 MG PO TABS: 1.0000 | ORAL_TABLET | ORAL | Status: DC | PRN

## 2020-02-09 NOTE — Progress Notes (Signed)
Physical Therapy Treatment Patient Details Name: Felicia Terry MRN: [id number] DOB: [birth year] Today's Date: 02/09/2020    History of Present Illness Pt is an 86 yo female s/p a fall at her assisted living facility, underwent L hip hemiarthroplasty, WBAT. PMH of CAD, HTN, afib, dementia.    PT Comments    Pt asleep, wake to touch but more lethargy noted this session compared to previous sessions. Pt also much less verbal and indicated significantly more pain with mobility as well. Supine <> sit maxAx2 for safety, pt resistive to movement. Once in sitting needed min-modA to maintain balance, unable to re-direct pt to improve tolerance to sitting. Returned to supine and repositioned for comfort, CNA in room to assist with breakfast. The patient would benefit from further skilled PT intervention to progress towards goals. Recommendation remains appropriate.     Follow Up Recommendations  SNF     Equipment Recommendations  Rolling walker with 5" wheels    Recommendations for Other Services       Precautions / Restrictions Precautions Precautions: Fall;Posterior Hip Precaution Booklet Issued: No Restrictions Weight Bearing Restrictions: Yes LLE Weight Bearing: Weight bearing as tolerated    Mobility  Bed Mobility Overal bed mobility: Needs Assistance Bed Mobility: Supine to Sit;Sit to Supine     Supine to sit: Max assist;+2 for safety/equipment;HOB elevated Sit to supine: Max assist;+2 for safety/equipment;HOB elevated      Transfers Overall transfer level: Needs assistance Equipment used: Rolling walker (2 wheeled)             General transfer comment: deferred due to pain signs/symptoms, pt moaning/crying at edge of bed  Ambulation/Gait                 Stairs             Wheelchair Mobility    Modified Rankin (Stroke Patients Only)       Balance Overall balance assessment: Needs assistance Sitting-balance support: Feet supported Sitting  balance-Leahy Scale: Poor Sitting balance - Comments: min-modA to maintain balance today                                    Cognition Arousal/Alertness: Lethargic Behavior During Therapy: Anxious;Agitated Overall Cognitive Status: No family/caregiver present to determine baseline cognitive functioning                                 General Comments: pt more lethargic today, indicated higher pain levels      Exercises      General Comments        Pertinent Vitals/Pain Pain Assessment: Faces Faces Pain Scale: Hurts worst Pain Location: with any attempts at mobility today Pain Descriptors / Indicators: Grimacing;Guarding;Moaning Pain Intervention(s): Limited activity within patient's tolerance;Monitored during session;Repositioned    Home Living                      Prior Function            PT Goals (current goals can now be found in the care plan section) Progress towards PT goals: Progressing toward goals    Frequency    7X/week      PT Plan Current plan remains appropriate    Co-evaluation              AM-PAC PT "6 Clicks" Mobility  Outcome Measure  Help needed turning from your back to your side while in a flat bed without using bedrails?: A Lot Help needed moving from lying on your back to sitting on the side of a flat bed without using bedrails?: A Lot Help needed moving to and from a bed to a chair (including a wheelchair)?: A Lot Help needed standing up from a chair using your arms (e.g., wheelchair or bedside chair)?: A Lot Help needed to walk in hospital room?: Total Help needed climbing 3-5 steps with a railing? : Total 6 Click Score: 10    End of Session Equipment Utilized During Treatment: Gait belt Activity Tolerance: Patient tolerated treatment well Patient left: in bed;with call bell/phone within reach;with bed alarm set;with SCD's reapplied;with nursing/sitter in room Nurse Communication:  Mobility status PT Visit Diagnosis: Other abnormalities of gait and mobility (R26.89);Muscle weakness (generalized) (M62.81);Pain Pain - Right/Left: Left Pain - part of body: Hip     Time: 2411-4643 PT Time Calculation (min) (ACUTE ONLY): 16 min  Charges:  $Therapeutic Activity: 8-22 mins                     Lieutenant Diego PT, DPT 10:49 AM,02/09/20

## 2020-02-09 NOTE — Progress Notes (Signed)
PROGRESS NOTE    Felicia Terry  JSE:[id number] DOB: 1930-01-28 DOA: 02/05/2020 PCP: [facility]    Chief Complaint  Patient presents with  . Fall    Brief Narrative:  Patient admitted to the hospitalwith theworking diagnosis of acute left hip fracture.  86 year old female with past medical history of coronary artery disease, hypertension, paroxysmal atrial fibrillation and dementia who presented after mechanical fall. Apparently she fell while trying to get into the bathroom, when EMS arrived she was found on the floor, with evident pain, unable to stand back on her feet. On her initial physical examination blood pressure 156/77, heart rate 61, temperature 98.8, respiratory 20, oxygen saturation 96%. Her lungs are clear to auscultation bilaterally, heart S1-S2, present rhythmic, abdomen soft, no lower extremity edema, positive left hip tenderness. Sodium 137, potassium 3.6, chloride 102, bicarb 24, glucose 106, BUN 21, creatinine 0.79, white count 9.0, hemoglobin 9.7, hematocrit 35.3, platelets 189. SARS COVID-19 was negative.Urinalysis specific gravity 1.012, 6-10 red cells, 0-5 white cells. Head and cervical spine no acute changes. Chest radiograph no infiltrates. EKG 70 bpm, normal axis, normal intervals, sinus rhythm, poor R wave progression, no ST segment or T wave changes  Left hip films with displaced left transcervical femoral neck    Assessment & Plan:   Principal Problem:   Closed left hip fracture (HCC) Active Problems:   Dementia (HCC)   Dyslipidemia   Paroxysmal atrial fibrillation (HCC)   Essential hypertension   CKD stage G2/A2, GFR 60-89 and albumin creatinine ratio 30-299 mg/g  1 acute left hip fracture Likely secondary to mechanical fall.  Patient seen by orthopedics.  Status post left hip hemiarthroplasty 02/06/2020.  Continue current pain regimen of Ultram, Norco, Robaxin, morphine.  PT/OT.  Per orthopedics.  2.  Paroxysmal atrial  fibrillation Continue Cardizem for rate control.  Patient not on anticoagulation prior to admission likely secondary to history of dementia and increased fall risk.  3.  Dementia with behavioral disturbance Patient with dementia, noted to have confusion and agitation yesterday and noted to be pulling off telemetry monitors and pulling out lines.  Improvement with agitation. Continue Aricept and mirtazapine.  Urinalysis negative for infection.  Continue current dose of Zyprexa 2.5 mg daily, IV Ativan as needed, trazodone nightly.  Supportive care.   4.  Gastroesophageal reflux disease PPI.  5.  Chronic kidney disease stage II Renal function stable.  Improved with gentle hydration.  Follow.  6.  Hypothyroidism Synthroid.   7.  Hypotension Patient with low blood pressure.  Patient on gentle hydration.  Noted yesterday to have low blood pressure.  Patient placed on IV fluids.  Nitrite negative, leukocytes negative.  Follow.   8.  Hyperlipidemia Statin.    DVT prophylaxis: Lovenox Code Status: DNR Family Communication: No family at bedside. Disposition:   Status is: Inpatient    Dispo: The patient is from: SNF              Anticipated d/c is to: Likely SNF              Anticipated d/c date is: 1 - 2 days              Patient currently with some bouts of agitation, not stable for discharge.       Consultants:   Orthopedics: Dr. Mack Guise 02/06/2020  Procedures:   CT head CT C-spine 02/06/2020  Chest x-ray 02/05/2020  Plain films of the left hip 02/05/2020  Left hip hemiarthroplasty per Dr.  Mack Guise 02/06/2020  Antimicrobials:  None   Subjective: Patient sitting up in bed.  Noted to be less agitated today.  Denies chest pain or shortness of breath.  Objective: Vitals:   02/08/20 1544 02/08/20 1546 02/08/20 2255 02/09/20 0754  BP: (!) 148/56 (!) 136/59 (!) 153/67 (!) 127/51  Pulse: (!) 116 (!) 55 64 (!) 49  Resp: 18 18 19 18   Temp: 98.9 F (37.2 C) 98.2 F  (36.8 C) 98.3 F (36.8 C) 97.9 F (36.6 C)  TempSrc: Oral  Oral Oral  SpO2: 93% 94% 93% 94%  Weight:      Height:        Intake/Output Summary (Last 24 hours) at 02/09/2020 1344 Last data filed at 02/09/2020 0600 Gross per 24 hour  Intake 1792.65 ml  Output --  Net 1792.65 ml   Filed Weights   02/05/20 2300 02/06/20 1306  Weight: 54.4 kg 54.4 kg    Examination:  General exam: Less agitated. Respiratory system: CTAB.  No wheezes, no crackles, no rhonchi.  Normal respiratory effort.   Cardiovascular system: RRR no murmurs rubs or gallops.  No JVD.  No lower extremity edema.  Gastrointestinal system: Abdomen is soft, nontender, nondistended, positive bowel sounds.  No rebound.  No guarding.  Central nervous system: Alert. No focal neurological deficits. Extremities: Symmetric 5 x 5 power. Skin: No rashes, lesions or ulcers Psychiatry: Judgement and insight appear poor - fair. Mood & affect appropriate.     Data Reviewed: I have personally reviewed following labs and imaging studies  CBC: Recent Labs  Lab 02/06/20 0046 02/06/20 0447 02/07/20 0246 02/08/20 0452 02/09/20 0327  WBC 9.0 11.4* 9.9 10.0 10.5  HGB 11.7* 11.4* 9.6* 9.2* 8.9*  HCT 35.3* 34.5* 29.3* 27.6* 26.0*  MCV 100.0 99.4 101.0* 101.5* 97.7  PLT 189 173 157 143* 146*    Basic Metabolic Panel: Recent Labs  Lab 02/06/20 0046 02/06/20 0447 02/07/20 0246 02/08/20 0452 02/09/20 0327  NA 137 139 138 136 136  K 3.6 3.8 3.7 3.8 3.4*  CL 102 105 107 107 105  CO2 24 25 25 23 26   GLUCOSE 106* 117* 119* 109* 127*  BUN 21 21 18  24* 17  CREATININE 0.79 0.76 0.76 0.73 0.68  CALCIUM 8.7* 8.3* 7.7* 7.9* 7.7*    GFR: Estimated Creatinine Clearance: 38.4 mL/min (by C-G formula based on SCr of 0.68 mg/dL).  Liver Function Tests: Recent Labs  Lab 02/06/20 0046  AST 27  ALT 17  ALKPHOS 71  BILITOT 0.9  PROT 6.8  ALBUMIN 3.7    CBG: No results for input(s): GLUCAP in the last 168 hours.   Recent  Results (from the past 240 hour(s))  SARS Coronavirus 2 by RT PCR (hospital order, performed in [facility] hospital lab) Nasopharyngeal Nasopharyngeal Swab     Status: None   Collection Time: 02/06/20 12:46 AM   Specimen: Nasopharyngeal Swab  Result Value Ref Range Status   SARS Coronavirus 2 NEGATIVE NEGATIVE Final    Comment: (NOTE) SARS-CoV-2 target nucleic acids are NOT DETECTED.  The SARS-CoV-2 RNA is generally detectable in upper and lower respiratory specimens during the acute phase of infection. The lowest concentration of SARS-CoV-2 viral copies this assay can detect is 250 copies / mL. A negative result does not preclude SARS-CoV-2 infection and should not be used as the sole basis for treatment or other patient management decisions.  A negative result may occur with improper specimen collection / handling, submission of specimen other than nasopharyngeal  swab, presence of viral mutation(s) within the areas targeted by this assay, and inadequate number of viral copies (<250 copies / mL). A negative result must be combined with clinical observations, patient history, and epidemiological information.  Fact Sheet for Patients:   StrictlyIdeas.no  Fact Sheet for Healthcare Providers: BankingDealers.co.za  This test is not yet approved or  cleared by the [place] FDA and has been authorized for detection and/or diagnosis of SARS-CoV-2 by FDA under an Emergency Use Authorization (EUA).  This EUA will remain in effect (meaning this test can be used) for the duration of the COVID-19 declaration under Section 564(b)(1) of the Act, 21 U.S.C. section 360bbb-3(b)(1), unless the authorization is terminated or revoked sooner.  Performed at [facility], [address], [place], [place] [address]   Surgical pcr screen     Status: None   Collection Time: 02/06/20  2:45 AM   Specimen: Nasal Mucosa; Nasal Swab  Result  Value Ref Range Status   MRSA, PCR NEGATIVE NEGATIVE Final   Staphylococcus aureus NEGATIVE NEGATIVE Final    Comment: (NOTE) The Xpert SA Assay (FDA approved for NASAL specimens in patients 50 years of age and older), is one component of a comprehensive surveillance program. It is not intended to diagnose infection nor to guide or monitor treatment. Performed at [facility], [address], [place], [place] [address]   Urine Culture     Status: None   Collection Time: 02/08/20 12:51 PM   Specimen: Urine, Random  Result Value Ref Range Status   Specimen Description   Final    URINE, RANDOM Performed at [facility], [address] [place], [place] [address]    Special Requests   Final    NONE Performed at [facility], [address] [place], [place] [address]    Culture   Final    NO GROWTH Performed at [facility], [place] [address] [place], [place] [address]    Report Status 02/09/2020 FINAL  Final         Radiology Studies: No results found.      Scheduled Meds: . calcium-vitamin D  1 tablet Oral BID WC  . Chlorhexidine Gluconate Cloth  6 each Topical Daily  . diltiazem  30 mg Oral BID  . docusate sodium  100 mg Oral BID  . donepezil  5 mg Oral QHS  . enoxaparin (LOVENOX) injection  40 mg Subcutaneous Q24H  . levothyroxine  25 mcg Oral Daily  . mirtazapine  7.5 mg Oral Daily  . multivitamin with minerals   Oral Daily  . OLANZapine zydis  2.5 mg Oral Daily  . pantoprazole  40 mg Oral Daily  . polyethylene glycol  17 g Oral Daily  . pravastatin  40 mg Oral QHS  . senna  1 tablet Oral BID  . traMADol  50 mg Oral Q6H   Continuous Infusions: . dextrose 5% lactated ringers 75 mL/hr at 02/09/20 1308  . methocarbamol (ROBAXIN) IV       LOS: 3 days    Time spent: 35 minutes    Irine Seal, MD [facility]   To contact the attending provider between 7A-7P or the covering provider during after hours  7P-7A, please log into the web site [email or web address] and access using universal [place] password for that web site. If you do not have the password, please call the hospital operator.  02/09/2020, 1:44 PM

## 2020-02-09 NOTE — Progress Notes (Signed)
  Subjective:  POD #3 s/p left hip hemiarthroplasty.   Patient has advanced dementia and is unable to provide a history.  I examined the patient this afternoon. I also saw the patient yesterday at lunchtime but realized today that I accidentally forgot to put a note in the chart.   Objective:   VITALS:   Vitals:   02/08/20 1546 02/08/20 2255 02/09/20 0754 02/09/20 1547  BP: (!) 136/59 (!) 153/67 (!) 127/51 137/64  Pulse: (!) 55 64 (!) 49 (!) 58  Resp: 18 19 18 20   Temp: 98.2 F (36.8 C) 98.3 F (36.8 C) 97.9 F (36.6 C) (!) 97.5 F (36.4 C)  TempSrc:  Oral Oral Oral  SpO2: 94% 93% 94% 96%  Weight:      Height:        PHYSICAL EXAM: Patient is confused but in no acute distress. She is laying in her hospital bed. Left lower extremity: An Aquacel dressing remains in position. Neurovascular intact Sensation intact distally Intact pulses distally Dorsiflexion/Plantar flexion intact Incision: scant drainage No cellulitis present Compartment soft  LABS  Results for orders placed or performed during the hospital encounter of 02/05/20 (from the past 24 hour(s))  CBC     Status: Abnormal   Collection Time: 02/09/20  3:27 AM  Result Value Ref Range   WBC 10.5 4.0 - 10.5 K/uL   RBC 2.66 (L) 3.87 - 5.11 MIL/uL   Hemoglobin 8.9 (L) 12.0 - 15.0 g/dL   HCT 26.0 (L) 36 - 46 %   MCV 97.7 80.0 - 100.0 fL   MCH 33.5 26.0 - 34.0 pg   MCHC 34.2 30.0 - 36.0 g/dL   RDW 14.5 11.5 - 15.5 %   Platelets 146 (L) 150 - 400 K/uL   nRBC 0.0 0.0 - 0.2 %  Basic metabolic panel     Status: Abnormal   Collection Time: 02/09/20  3:27 AM  Result Value Ref Range   Sodium 136 135 - 145 mmol/L   Potassium 3.4 (L) 3.5 - 5.1 mmol/L   Chloride 105 98 - 111 mmol/L   CO2 26 22 - 32 mmol/L   Glucose, Bld 127 (H) 70 - 99 mg/dL   BUN 17 8 - 23 mg/dL   Creatinine, Ser 0.68 0.44 - 1.00 mg/dL   Calcium 7.7 (L) 8.9 - 10.3 mg/dL   GFR calc non Af Amer >60 >60 mL/min   GFR calc Af Amer >60 >60 mL/min   Anion  gap 5 5 - 15    No results found.  Assessment/Plan: 3 Days Post-Op   Principal Problem:   Closed left hip fracture (HCC) Active Problems:   Dementia (HCC)   Dyslipidemia   Paroxysmal atrial fibrillation (HCC)   Essential hypertension   CKD stage G2/A2, GFR 60-89 and albumin creatinine ratio 30-299 mg/g  Patient is stable from an orthopedic standpoint. Her examination is unchanged today. Patient may continue physical therapy as she is able. Patient will need a skilled nursing facility upon discharge. Continue to observe posterior hip precautions. The patient is weightbearing as tolerated on left lower extremity. Patient should continue Lovenox for DVT prophylaxis until her follow-up in the office 10 to 14 days after discharge.   Thornton Park , MD 02/09/2020, 5:16 PM

## 2020-02-10 LAB — BASIC METABOLIC PANEL
Anion gap: 5 (ref 5–15)
BUN: 15 mg/dL (ref 8–23)
CO2: 26 mmol/L (ref 22–32)
Calcium: 7.8 mg/dL — ABNORMAL LOW (ref 8.9–10.3)
Chloride: 105 mmol/L (ref 98–111)
Creatinine, Ser: 0.55 mg/dL (ref 0.44–1.00)
GFR calc Af Amer: >60 mL/min (ref >60)
GFR calc non Af Amer: >60 mL/min (ref >60)
Glucose, Bld: 116 mg/dL — ABNORMAL HIGH (ref 70–99)
Potassium: 3.6 mmol/L (ref 3.5–5.1)
Sodium: 136 mmol/L (ref 135–145)

## 2020-02-10 LAB — HEMOGLOBIN AND HEMATOCRIT, BLOOD
HCT: 28.6 % — ABNORMAL LOW (ref 36.0–46.0)
Hemoglobin: 9.8 g/dL — ABNORMAL LOW (ref 12.0–15.0)

## 2020-02-10 LAB — SURGICAL PATHOLOGY

## 2020-02-10 LAB — SARS CORONAVIRUS 2 BY RT PCR (HOSPITAL ORDER, PERFORMED IN ~~LOC~~ HOSPITAL LAB): SARS Coronavirus 2: NEGATIVE

## 2020-02-10 LAB — VITAMIN D 25 HYDROXY (VIT D DEFICIENCY, FRACTURES): Vit D, 25-Hydroxy: 28.57 ng/mL — ABNORMAL LOW (ref 30–100)

## 2020-02-10 MED ORDER — MULTI-VITAMINS PO TABS: 1.0000 | ORAL_TABLET | Freq: Every day | ORAL | Status: AC

## 2020-02-10 MED ORDER — METHOCARBAMOL 500 MG PO TABS: 500.0000 mg | ORAL_TABLET | Freq: Four times a day (QID) | ORAL | 0 refills | Status: AC | PRN

## 2020-02-10 MED ORDER — ENOXAPARIN SODIUM 40 MG/0.4ML ~~LOC~~ SOLN: 40.0000 mg | SUBCUTANEOUS | Status: AC

## 2020-02-10 MED ORDER — OLANZAPINE 5 MG PO TBDP: 2.5000 mg | ORAL_TABLET | Freq: Every day | ORAL | 0 refills | Status: AC

## 2020-02-10 MED ORDER — HYDROCODONE-ACETAMINOPHEN 5-325 MG PO TABS
1.0000 | ORAL_TABLET | ORAL | 0 refills | Status: AC | PRN
Start: 2020-02-10 — End: ?

## 2020-02-10 MED ORDER — DONEPEZIL HCL 5 MG PO TABS: 5.0000 mg | ORAL_TABLET | Freq: Every day | ORAL | 1 refills | Status: AC

## 2020-02-10 MED ORDER — SENNA 8.6 MG PO TABS: 1.0000 | ORAL_TABLET | Freq: Two times a day (BID) | ORAL | 0 refills | Status: AC

## 2020-02-10 MED ORDER — CALCIUM CARBONATE-VITAMIN D 500-200 MG-UNIT PO TABS: 1.0000 | ORAL_TABLET | Freq: Two times a day (BID) | ORAL | Status: AC

## 2020-02-10 MED ORDER — LEVOTHYROXINE SODIUM 25 MCG PO TABS: 25.0000 ug | ORAL_TABLET | Freq: Every day | ORAL | Status: AC

## 2020-02-10 MED ORDER — ESOMEPRAZOLE MAGNESIUM 40 MG PO CPDR: 40.0000 mg | DELAYED_RELEASE_CAPSULE | Freq: Every day | ORAL | Status: AC

## 2020-02-10 MED ORDER — DILTIAZEM HCL 30 MG PO TABS: 30.0000 mg | ORAL_TABLET | Freq: Two times a day (BID) | ORAL | Status: AC

## 2020-02-10 MED ORDER — TRAMADOL HCL 50 MG PO TABS
50.0000 mg | ORAL_TABLET | Freq: Four times a day (QID) | ORAL | 0 refills | Status: AC
Start: 2020-02-10 — End: 2020-02-15

## 2020-02-10 MED ORDER — POLYETHYLENE GLYCOL 3350 17 G PO PACK: 17.0000 g | PACK | Freq: Every day | ORAL | 0 refills | Status: AC

## 2020-02-10 NOTE — Progress Notes (Signed)
  Subjective:  POD #4 s/p left hip hemiarthroplasty.   Patient confused, but in no acute distress.   EMS at bedside to transfer to SNF.    Objective:   VITALS:   Vitals:   02/09/20 2040 02/09/20 2339 02/10/20 0805 02/10/20 1536  BP: (!) 157/71 132/68 (!) 133/51 (!) 123/58  Pulse: 61 (!) 55 60 (!) 59  Resp: 20 16 17 16   Temp: 97.9 F (36.6 C) 97.8 F (36.6 C) 98.4 F (36.9 C) 97.9 F (36.6 C)  TempSrc: Oral Oral Oral   SpO2: 100% 99% 96% 97%  Weight:      Height:        PHYSICAL EXAM: Left lower extremity:  aquacel dressing in place. Intact pulses distally Incision: moderate drainage No cellulitis present Compartment soft  LABS  Results for orders placed or performed during the hospital encounter of 02/05/20 (from the past 24 hour(s))  Basic metabolic panel     Status: Abnormal   Collection Time: 02/10/20  3:36 AM  Result Value Ref Range   Sodium 136 135 - 145 mmol/L   Potassium 3.6 3.5 - 5.1 mmol/L   Chloride 105 98 - 111 mmol/L   CO2 26 22 - 32 mmol/L   Glucose, Bld 116 (H) 70 - 99 mg/dL   BUN 15 8 - 23 mg/dL   Creatinine, Ser 0.55 0.44 - 1.00 mg/dL   Calcium 7.8 (L) 8.9 - 10.3 mg/dL   GFR calc non Af Amer >60 >60 mL/min   GFR calc Af Amer >60 >60 mL/min   Anion gap 5 5 - 15  Hemoglobin and hematocrit, blood     Status: Abnormal   Collection Time: 02/10/20  3:36 AM  Result Value Ref Range   Hemoglobin 9.8 (L) 12.0 - 15.0 g/dL   HCT 28.6 (L) 36 - 46 %  SARS Coronavirus 2 by RT PCR (hospital order, performed in [place] hospital lab) Nasopharyngeal Nasopharyngeal Swab     Status: None   Collection Time: 02/10/20 11:15 AM   Specimen: Nasopharyngeal Swab  Result Value Ref Range   SARS Coronavirus 2 NEGATIVE NEGATIVE    No results found.  Assessment/Plan: 4 Days Post-Op   Principal Problem:   Closed left hip fracture (HCC) Active Problems:   Dementia (HCC)   Dyslipidemia   Paroxysmal atrial fibrillation (HCC)   Essential hypertension   CKD stage  G2/A2, GFR 60-89 and albumin creatinine ratio 30-299 mg/g  Patient stable from an orthopaedic standpoint.  Discharging to SNF today.  Follow up in 10-14 days.       Thornton Park , MD 02/10/2020, 5:33 PM

## 2020-02-10 NOTE — Progress Notes (Signed)
Patient discharged via EMS at this time to [facility].

## 2020-02-10 NOTE — Care Management Important Message (Signed)
Important Message  Patient Details  Name: CHRISTA FASIG MRN: [id number] Date of Birth: 1930/03/05   Medicare Important Message Given:  Yes     Dannette Barbara 02/10/2020, 1:39 PM

## 2020-02-10 NOTE — Progress Notes (Signed)
Physical Therapy Treatment Patient Details Name: Felicia Terry MRN: [id number] DOB: June 11, 1929 Today's Date: 02/10/2020    History of Present Illness Pt is an 86 yo female s/p a fall at her assisted living facility, underwent L hip hemiarthroplasty, WBAT. PMH of CAD, HTN, afib, dementia.    PT Comments    Pt was long sitting in bed with RN tech and RN in room. Pt is confused. Son arrived during session and states that, pt's cognition is the same as she has been for ~ 6 years. He reports she "fights" taking medication all the time. Pt is very anxious and mumbles a lot. Gets slightly agitated at times but able to be redirected. She required max assist to achieve EOB sitting and max assist to stand to RW 1 x and 1 x stand pivot to recliner. Pt requires increased time and constant cueing for safety. Rn aware of pt's abilities and concerns. CM notified son was requesting to speak with them. Pt was in recliner with chair alarm in place, call bell in reach, and son at bedside.    Follow Up Recommendations  SNF     Equipment Recommendations  Other (comment) (defer to next level of care)    Recommendations for Other Services       Precautions / Restrictions Precautions Precautions: Fall;Posterior Hip Precaution Booklet Issued: No Restrictions Weight Bearing Restrictions: Yes LLE Weight Bearing: Weight bearing as tolerated    Mobility  Bed Mobility Overal bed mobility: Needs Assistance Bed Mobility: Supine to Sit     Supine to sit: Max assist;HOB elevated     General bed mobility comments: Max assist to exit bed with increased time and constant vcs. pt moans throughout  Transfers Overall transfer level: Needs assistance Equipment used: Rolling walker (2 wheeled);None Transfers: Sit to/from [facility] Sit to Stand: Max assist;From elevated surface Stand pivot transfers: Max assist;From elevated surface       General transfer comment: max assist to stand one  time to RW and max assist to stand pivot one time towards L without use of AD  Ambulation/Gait             General Gait Details: unsafe      Balance Overall balance assessment: Needs assistance Sitting-balance support: Feet supported Sitting balance-Leahy Scale: Good Sitting balance - Comments: pt sat EOB x ~ 12 minutes throughout session with supervision   Standing balance support: Bilateral upper extremity supported;During functional activity Standing balance-Leahy Scale: Poor Standing balance comment: reliant on UE support to stand           Cognition Arousal/Alertness: Awake/alert Behavior During Therapy: Anxious Overall Cognitive Status: History of cognitive impairments - at baseline        General Comments: pt has baseline dementia and per son is like this all the time.                    Pertinent Vitals/Pain Pain Assessment:  (unable to rate) Pain Location: RLE pain with movements (LLE surgical) Pain Descriptors / Indicators: Grimacing;Guarding;Moaning Pain Intervention(s): Limited activity within patient's tolerance;Monitored during session;Repositioned    Home Living                      Prior Function            PT Goals (current goals can now be found in the care plan section) Acute Rehab PT Goals Patient Stated Goal: none stated Progress towards PT goals: Progressing toward goals  Frequency    7X/week      PT Plan Current plan remains appropriate    Co-evaluation              AM-PAC PT "6 Clicks" Mobility   Outcome Measure  Help needed turning from your back to your side while in a flat bed without using bedrails?: A Lot Help needed moving from lying on your back to sitting on the side of a flat bed without using bedrails?: A Lot Help needed moving to and from a bed to a chair (including a wheelchair)?: A Lot Help needed standing up from a chair using your arms (e.g., wheelchair or bedside chair)?: A Lot Help  needed to walk in hospital room?: A Lot Help needed climbing 3-5 steps with a railing? : Total 6 Click Score: 11    End of Session Equipment Utilized During Treatment: Gait belt Activity Tolerance: Patient tolerated treatment well Patient left: in chair;with call bell/phone within reach;with chair alarm set;with nursing/sitter in room;with family/visitor present Nurse Communication: Mobility status PT Visit Diagnosis: Other abnormalities of gait and mobility (R26.89);Muscle weakness (generalized) (M62.81);Pain Pain - Right/Left: Left Pain - part of body: Hip     Time: 0940-1010 PT Time Calculation (min) (ACUTE ONLY): 30 min  Charges:  $Therapeutic Activity: 23-37 mins                     Julaine Fusi PTA 02/10/20, 1:27 PM

## 2020-02-10 NOTE — Discharge Summary (Addendum)
Physician Discharge Summary  Felicia Terry FHL:[id number] DOB: 01-28-1930 DOA: 02/05/2020  PCP: [facility]  Admit date: 02/05/2020 Discharge date: 02/10/2020  Time spent: 55 minutes  Recommendations for Outpatient Follow-up:  1. Follow-up with MD at skilled nursing facility.  Patient will need a basic metabolic profile done in 1 week to follow-up on electrolytes and renal function. 2. Follow-up with Dr. Mack Guise, orthopedics in 10 to 14 days.   Discharge Diagnoses:  Principal Problem:   Closed left hip fracture ([place]) Active Problems:   Dementia (Felicia Terry)   Dyslipidemia   Paroxysmal atrial fibrillation ([facility])   Essential hypertension   CKD stage G2/A2, GFR 60-89 and albumin creatinine ratio 30-299 mg/g   Discharge Condition: Stable and improved  Diet recommendation: Regular diet  Filed Weights   02/05/20 2300 02/06/20 1306  Weight: 54.4 kg 54.4 kg    History of present illness:  HPI per Dr. [name]  is a 86 y.o. pleasantly demented Caucasian female with a known history of coronary artery disease, hypertension, paroxysmal atrial fibrillation on no anticoagulation and dementia, who presented to the emergency room with acute onset of left hip pain after having unwitnessed fall at her skilled nursing facility.  Per EMS the patient was found on the floor outside in her bathroom.  She was also complaining of left leg pain and was holding it in crying in the ER.  No reported chest pain or dyspnea or cough or wheezing.  No reported fever or chills.  No headache or dizziness or blurred vision.  No presyncope or syncope were reported.  Upon presentation to the emergency room, blood pressure was 156/77 with otherwise normal vital signs.  Labs revealed potassium of 3.6 and BUN of 24 with otherwise unremarkable CMP and CBC.  Noncontrasted head CT scan and C-spine CT scan as mentioned below with no acute findings.  Left hip x-ray as below revealed a displaced left  femoral neck fracture.  Chest x-ray showed marked severity calcification of the aortic arch and degenerative changes in both shoulders and thoracic spine with no acute cardiopulmonary disease. EKG showed sinus rhythm with rate of 70 with poor R wave progression and slightly prolonged PR interval.  The patient will be admitted to a medically monitored bed for further evaluation and management.  Hospital Course:  1 acute left hip fracture Likely secondary to mechanical fall.  Patient seen by orthopedics.  Status post left hip hemiarthroplasty 02/06/2020.  Patient was placed on pain regimen of scheduled Ultram, Norco as needed, Robaxin as needed, IV morphine as needed.  Patient was seen by PT OT and followed by orthopedics.  Patient's pain was controlled.  Orthopedics recommended Lovenox for DVT prophylaxis on discharge.  Patient be discharged on Lovenox for DVT prophylaxis until follow-up with orthopedics.  Outpatient follow-up with orthopedics, Dr. Mack Guise in 10 to 14 days .  Patient will be discharged to skilled nursing facility in stable condition.   2.  Paroxysmal atrial fibrillation Patient was maintained on Cardizem for rate control. Patient not on anticoagulation prior to admission likely secondary to history of dementia and increased fall risk.  3.  Dementia with behavioral disturbance Patient with dementia, noted to have confusion and agitation  early on in the hospitalization, and noted to be pulling off telemetry monitors and pulling out lines.  Infectious etiology ruled out as well as urinary retention.  Patient maintained on home regimen of Aricept and mirtazapine.  Patient was subsequently started on Zyprexa 2.5 mg daily as  well as IV Ativan as needed and trazodone at bedtime with improvement with agitation.  Patient was stable by day of discharge.  Outpatient follow-up.  4.  Gastroesophageal reflux disease Patient maintained on PPI.  5.  Chronic kidney disease stage II Renal  function remained stable.  Outpatient follow-up.   6.  Hypothyroidism Patient maintained on home regimen Synthroid.   7.  Hypotension Patient with low blood pressure.    Patient hydrated gently with improvement with blood pressure.  Outpatient follow-up.   8.  Hyperlipidemia Patient maintained on statin.   9.  Stage I mid coccyx pressure injury, POA Pressure Injury 02/09/20 Coccyx Mid Stage 1 -  Intact skin with non-blanchable redness of a localized area usually over a bony prominence. (Active)  02/09/20   Location: Coccyx  Location Orientation: Mid  Staging: Stage 1 -  Intact skin with non-blanchable redness of a localized area usually over a bony prominence.  Wound Description (Comments):   Present on Admission:         Procedures:  CT head CT C-spine 02/06/2020  Chest x-ray 02/05/2020  Plain films of the left hip 02/05/2020  Left hip hemiarthroplasty per Dr. Mack Guise 02/06/2020   Consultations:  Orthopedics: Dr. Mack Guise 02/06/2020  Discharge Exam: Vitals:   02/09/20 2339 02/10/20 0805  BP: 132/68 (!) 133/51  Pulse: (!) 55 60  Resp: 16 17  Temp: 97.8 F (36.6 C) 98.4 F (36.9 C)  SpO2: 99% 96%    General: NAD Cardiovascular: Regular rate and rhythm no murmurs rubs or gallops. Respiratory: Clear to auscultation bilaterally.  Discharge Instructions   Discharge Instructions    Diet general   Complete by: As directed    Discharge wound care:   Complete by: As directed    As above   Increase activity slowly   Complete by: As directed      Allergies as of 02/10/2020   No Known Allergies     Medication List    STOP taking these medications   risperiDONE 0.25 MG tablet Commonly known as: RISPERDAL     TAKE these medications   calcium-vitamin D 500-200 MG-UNIT tablet Commonly known as: OSCAL WITH D Take 1 tablet by mouth 2 (two) times daily with a meal.   diltiazem 30 MG tablet Commonly known as: CARDIZEM Take 1 tablet (30 mg total) by  mouth 2 (two) times daily.   donepezil 5 MG tablet Commonly known as: Aricept Take 1 tablet (5 mg total) by mouth at bedtime.   enoxaparin 40 MG/0.4ML injection Commonly known as: LOVENOX Inject 0.4 mLs (40 mg total) into the skin daily. Start taking on: February 11, 2020   esomeprazole 40 MG capsule Commonly known as: NEXIUM Take 1 capsule (40 mg total) by mouth daily.   HYDROcodone-acetaminophen 5-325 MG tablet Commonly known as: NORCO/VICODIN Take 1 tablet by mouth every 4 (four) hours as needed for moderate pain (pain score 4-6).   levothyroxine 25 MCG tablet Commonly known as: SYNTHROID Take 1 tablet (25 mcg total) by mouth daily.   methocarbamol 500 MG tablet Commonly known as: ROBAXIN Take 1 tablet (500 mg total) by mouth every 6 (six) hours as needed for muscle spasms.   mirtazapine 7.5 MG tablet Commonly known as: REMERON Take 1 tablet by mouth daily.   Multi-Vitamins Tabs Take 1 tablet by mouth daily. What changed: how much to take   NON FORMULARY   OLANZapine zydis 5 MG disintegrating tablet Commonly known as: ZYPREXA Take 0.5 tablets (2.5 mg total)  by mouth daily. Start taking on: February 11, 2020   polyethylene glycol 17 g packet Commonly known as: MIRALAX / GLYCOLAX Take 17 g by mouth daily. Start taking on: February 11, 2020   pravastatin 40 MG tablet Commonly known as: PRAVACHOL Take 40 mg by mouth at bedtime.   senna 8.6 MG Tabs tablet Commonly known as: SENOKOT Take 1 tablet (8.6 mg total) by mouth 2 (two) times daily.   traMADol 50 MG tablet Commonly known as: ULTRAM Take 1 tablet (50 mg total) by mouth every 6 (six) hours for 5 days.   traZODone 50 MG tablet Commonly known as: DESYREL Take 50 mg by mouth at bedtime as needed for sleep.   Tylenol Arthritis Pain 650 MG CR tablet Generic drug: acetaminophen Take 650 mg by mouth every 8 (eight) hours as needed for pain.            Discharge Care Instructions  (From admission,  onward)         Start     Ordered   02/10/20 0000  Discharge wound care:       Comments: As above   02/10/20 1511         No Known Allergies  Contact information for follow-up providers    MD AT SNF Follow up.        [place], MD. Schedule an appointment as soon as possible for a visit in 10 day(s).   Specialty: Orthopedic Surgery Why: F/U IN 10-14 DAYS Contact information: [place] [address] [phone]            Contact information for after-discharge care    [facility] .   Service: Skilled Nursing Contact information: [place] [phone]                   The results of significant diagnostics from this hospitalization (including imaging, microbiology, ancillary and laboratory) are listed below for reference.    Significant Diagnostic Studies: DG Chest 1 View  Result Date: 02/06/2020 CLINICAL DATA:  Status post fall.  Preoperative evaluation. EXAM: CHEST  1 VIEW COMPARISON:  February 09, 2018 FINDINGS: There is no evidence of acute infiltrate, pleural effusion or pneumothorax. The heart size and mediastinal contours are within normal limits. There is marked severity calcification of the aortic arch. Degenerative changes seen involving the bilateral shoulders and thoracic spine. IMPRESSION: No active disease. Electronically Signed   By: Virgina Norfolk M.D.   On: 02/06/2020 00:03   CT Head Wo Contrast  Result Date: 02/06/2020 CLINICAL DATA:  Fall EXAM: CT HEAD WITHOUT CONTRAST TECHNIQUE: Contiguous axial images were obtained from the base of the skull through the vertex without intravenous contrast. COMPARISON:  None. FINDINGS: Brain: No evidence of acute territorial infarction, hemorrhage, hydrocephalus,extra-axial collection or mass lesion/mass effect. There is dilatation the ventricles and sulci consistent with age-related atrophy.  Low-attenuation changes in the deep white matter consistent with small vessel ischemia. Vascular: No hyperdense vessel or unexpected calcification. Skull: The skull is intact. No fracture or focal lesion identified. Sinuses/Orbits: The visualized paranasal sinuses and mastoid air cells are clear. The orbits and globes intact. Other: None Cervical spine: Alignment: There is a grade 1 anterolisthesis C3-C4 measuring 3 mm C4-C5 measuring 4 mm which is unchanged since 2014. Skull base and vertebrae: Visualized skull base is intact. No atlanto-occipital dissociation. The vertebral body heights are well maintained. No fracture or pathologic  osseous lesion seen. Soft tissues and spinal canal: The visualized paraspinal soft tissues are unremarkable. No prevertebral soft tissue swelling is seen. The spinal canal is grossly unremarkable, no large epidural collection or significant canal narrowing. Disc levels: Multilevel spine spondylosis with disc osteophyte uncovertebral osteophytes most notable C5-C6 severe neural foraminal narrowing and moderate central canal stenosis. Upper chest: Biapical scarring is seen. Thoracic inlet is within normal limits. Other: None IMPRESSION: No acute intracranial abnormality. Findings consistent with age related atrophy and chronic small vessel ischemia No acute fracture or malalignment of the spine. Electronically Signed   By: Prudencio Pair M.D.   On: 02/06/2020 00:12   CT Cervical Spine Wo Contrast  Result Date: 02/06/2020 CLINICAL DATA:  Fall EXAM: CT HEAD WITHOUT CONTRAST TECHNIQUE: Contiguous axial images were obtained from the base of the skull through the vertex without intravenous contrast. COMPARISON:  None. FINDINGS: Brain: No evidence of acute territorial infarction, hemorrhage, hydrocephalus,extra-axial collection or mass lesion/mass effect. There is dilatation the ventricles and sulci consistent with age-related atrophy. Low-attenuation changes in the deep white matter  consistent with small vessel ischemia. Vascular: No hyperdense vessel or unexpected calcification. Skull: The skull is intact. No fracture or focal lesion identified. Sinuses/Orbits: The visualized paranasal sinuses and mastoid air cells are clear. The orbits and globes intact. Other: None Cervical spine: Alignment: There is a grade 1 anterolisthesis C3-C4 measuring 3 mm C4-C5 measuring 4 mm which is unchanged since 2014. Skull base and vertebrae: Visualized skull base is intact. No atlanto-occipital dissociation. The vertebral body heights are well maintained. No fracture or pathologic osseous lesion seen. Soft tissues and spinal canal: The visualized paraspinal soft tissues are unremarkable. No prevertebral soft tissue swelling is seen. The spinal canal is grossly unremarkable, no large epidural collection or significant canal narrowing. Disc levels: Multilevel spine spondylosis with disc osteophyte uncovertebral osteophytes most notable C5-C6 severe neural foraminal narrowing and moderate central canal stenosis. Upper chest: Biapical scarring is seen. Thoracic inlet is within normal limits. Other: None IMPRESSION: No acute intracranial abnormality. Findings consistent with age related atrophy and chronic small vessel ischemia No acute fracture or malalignment of the spine. Electronically Signed   By: Prudencio Pair M.D.   On: 02/06/2020 00:12   DG Hip Port Unilat With Pelvis 1V Left  Result Date: 02/06/2020 CLINICAL DATA:  LEFT hip hemiarthroplasty EXAM: DG HIP (WITH OR WITHOUT PELVIS) 1V PORT LEFT COMPARISON:  02/05/2020 FINDINGS: LEFT hip hemiarthroplasty changes noted without complicating features. No acute fracture or dislocation. IMPRESSION: LEFT hip hemiarthroplasty without complicating features. Electronically Signed   By: Margarette Canada M.D.   On: 02/06/2020 16:55   DG Hip Unilat W or Wo Pelvis 2-3 Views Left  Result Date: 02/06/2020 CLINICAL DATA:  Fall and pain EXAM: DG HIP (WITH OR WITHOUT PELVIS)  2-3V LEFT COMPARISON:  None. FINDINGS: There is a displaced transcervical femoral neck fracture of the left hip. There is superior displacement of the femoral shaft. The femoral head is rotated however still within the acetabulum. There is diffuse osteopenia noted. Overlying soft tissue swelling. IMPRESSION: Displaced left transcervical femoral neck fracture. Electronically Signed   By: Prudencio Pair M.D.   On: 02/06/2020 00:06    Microbiology: Recent Results (from the past 240 hour(s))  SARS Coronavirus 2 by RT PCR (hospital order, performed in [facility] hospital lab) Nasopharyngeal Nasopharyngeal Swab     Status: None   Collection Time: 02/06/20 12:46 AM   Specimen: Nasopharyngeal Swab  Result Value Ref Range Status  SARS Coronavirus 2 NEGATIVE NEGATIVE Final    Comment: (NOTE) SARS-CoV-2 target nucleic acids are NOT DETECTED.  The SARS-CoV-2 RNA is generally detectable in upper and lower respiratory specimens during the acute phase of infection. The lowest concentration of SARS-CoV-2 viral copies this assay can detect is 250 copies / mL. A negative result does not preclude SARS-CoV-2 infection and should not be used as the sole basis for treatment or other patient management decisions.  A negative result may occur with improper specimen collection / handling, submission of specimen other than nasopharyngeal swab, presence of viral mutation(s) within the areas targeted by this assay, and inadequate number of viral copies (<250 copies / mL). A negative result must be combined with clinical observations, patient history, and epidemiological information.  Fact Sheet for Patients:   StrictlyIdeas.no  Fact Sheet for Healthcare Providers: BankingDealers.co.za  This test is not yet approved or  cleared by the [place] FDA and has been authorized for detection and/or diagnosis of SARS-CoV-2 by FDA under an Emergency Use Authorization  (EUA).  This EUA will remain in effect (meaning this test can be used) for the duration of the COVID-19 declaration under Section 564(b)(1) of the Act, 21 U.S.C. section 360bbb-3(b)(1), unless the authorization is terminated or revoked sooner.  Performed at [facility], [address], [place], [place] [address]   Surgical pcr screen     Status: None   Collection Time: 02/06/20  2:45 AM   Specimen: Nasal Mucosa; Nasal Swab  Result Value Ref Range Status   MRSA, PCR NEGATIVE NEGATIVE Final   Staphylococcus aureus NEGATIVE NEGATIVE Final    Comment: (NOTE) The Xpert SA Assay (FDA approved for NASAL specimens in patients 29 years of age and older), is one component of a comprehensive surveillance program. It is not intended to diagnose infection nor to guide or monitor treatment. Performed at [facility], [address], [place], [place] [address]   Urine Culture     Status: None   Collection Time: 02/08/20 12:51 PM   Specimen: Urine, Random  Result Value Ref Range Status   Specimen Description   Final    URINE, RANDOM Performed at [facility], [address], [place], [place] [address]    Special Requests   Final    NONE Performed at [facility], [address] [place], [place] [address]    Culture   Final    NO GROWTH Performed at [facility], [place] [address] [place], [place] [address]    Report Status 02/09/2020 FINAL  Final  SARS Coronavirus 2 by RT PCR (hospital order, performed in [facility] hospital lab) Nasopharyngeal Nasopharyngeal Swab     Status: None   Collection Time: 02/10/20 11:15 AM   Specimen: Nasopharyngeal Swab  Result Value Ref Range Status   SARS Coronavirus 2 NEGATIVE NEGATIVE Final    Comment: (NOTE) SARS-CoV-2 target nucleic acids are NOT DETECTED.  The SARS-CoV-2 RNA is generally detectable in upper and lower respiratory specimens during the acute phase of infection. The lowest concentration  of SARS-CoV-2 viral copies this assay can detect is 250 copies / mL. A negative result does not preclude SARS-CoV-2 infection and should not be used as the sole basis for treatment or other patient management decisions.  A negative result may occur with improper specimen collection / handling, submission of specimen other than nasopharyngeal swab, presence of viral mutation(s) within the areas targeted by this assay, and inadequate number of viral copies (<  250 copies / mL). A negative result must be combined with clinical observations, patient history, and epidemiological information.  Fact Sheet for Patients:   StrictlyIdeas.no  Fact Sheet for Healthcare Providers: BankingDealers.co.za  This test is not yet approved or  cleared by the [place] FDA and has been authorized for detection and/or diagnosis of SARS-CoV-2 by FDA under an Emergency Use Authorization (EUA).  This EUA will remain in effect (meaning this test can be used) for the duration of the COVID-19 declaration under Section 564(b)(1) of the Act, 21 U.S.C. section 360bbb-3(b)(1), unless the authorization is terminated or revoked sooner.  Performed at [facility], [address] [place], [place] [address]      Labs: Basic Metabolic Panel: Recent Labs  Lab 02/06/20 0447 02/07/20 0246 02/08/20 0452 02/09/20 0327 02/10/20 0336  NA 139 138 136 136 136  K 3.8 3.7 3.8 3.4* 3.6  CL 105 107 107 105 105  CO2 25 25 23 26 26   GLUCOSE 117* 119* 109* 127* 116*  BUN 21 18 24* 17 15  CREATININE 0.76 0.76 0.73 0.68 0.55  CALCIUM 8.3* 7.7* 7.9* 7.7* 7.8*   Liver Function Tests: Recent Labs  Lab 02/06/20 0046  AST 27  ALT 17  ALKPHOS 71  BILITOT 0.9  PROT 6.8  ALBUMIN 3.7   No results for input(s): LIPASE, AMYLASE in the last 168 hours. No results for input(s): AMMONIA in the last 168 hours. CBC: Recent Labs  Lab 02/06/20 0046 02/06/20 0046  02/06/20 0447 02/07/20 0246 02/08/20 0452 02/09/20 0327 02/10/20 0336  WBC 9.0  --  11.4* 9.9 10.0 10.5  --   HGB 11.7*   < > 11.4* 9.6* 9.2* 8.9* 9.8*  HCT 35.3*   < > 34.5* 29.3* 27.6* 26.0* 28.6*  MCV 100.0  --  99.4 101.0* 101.5* 97.7  --   PLT 189  --  173 157 143* 146*  --    < > = values in this interval not displayed.   Cardiac Enzymes: No results for input(s): CKTOTAL, CKMB, CKMBINDEX, TROPONINI in the last 168 hours. BNP: BNP (last 3 results) No results for input(s): BNP in the last 8760 hours.  ProBNP (last 3 results) No results for input(s): PROBNP in the last 8760 hours.  CBG: No results for input(s): GLUCAP in the last 168 hours.     Signed:  Irine Seal MD.  [facility] 02/10/2020, 3:21 PM

## 2020-02-10 NOTE — TOC Progression Note (Signed)
Transition of Care [facility]) - Progression Note    Patient Details  Name: Felicia Terry MRN: [id number] Date of Birth: 11-22-1929  Transition of Care [facility]) CM/SW [place], RN Phone Number: 02/10/2020, 3:38 PM  Clinical Narrative:   DC packet on the chart, Spoke with the son Dominica Severin and notified him that the patient will be discharged to [facility] today via EMS he is agreeable, I provided him with the number to [facility], Bedside nurse aware         Expected Discharge Plan and Services           Expected Discharge Date: 02/10/20                                     Social Determinants of Health (SDOH) Interventions    Readmission Risk Interventions No flowsheet data found.

## 2021-12-07 DEATH — deceased
